# Patient Record
Sex: Male | Born: 1950
Health system: Southern US, Community
[De-identification: ages and names within clinical notes are randomized; demographics above are authoritative.]

## PROBLEM LIST (undated history)

## (undated) DIAGNOSIS — T148XXD Other injury of unspecified body region, subsequent encounter: Secondary | ICD-10-CM

## (undated) DIAGNOSIS — E119 Type 2 diabetes mellitus without complications: Secondary | ICD-10-CM

## (undated) DIAGNOSIS — R0602 Shortness of breath: Secondary | ICD-10-CM

## (undated) DIAGNOSIS — K259 Gastric ulcer, unspecified as acute or chronic, without hemorrhage or perforation: Secondary | ICD-10-CM

## (undated) DIAGNOSIS — M1990 Unspecified osteoarthritis, unspecified site: Secondary | ICD-10-CM

## (undated) DIAGNOSIS — I509 Heart failure, unspecified: Secondary | ICD-10-CM

## (undated) DIAGNOSIS — I10 Essential (primary) hypertension: Secondary | ICD-10-CM

## (undated) DIAGNOSIS — I4891 Unspecified atrial fibrillation: Secondary | ICD-10-CM

## (undated) DIAGNOSIS — J449 Chronic obstructive pulmonary disease, unspecified: Secondary | ICD-10-CM

## (undated) HISTORY — DX: Unspecified atrial fibrillation: I48.91

## (undated) HISTORY — PX: TRANSURETHRAL RESECTION OF PROSTATE: SHX73

## (undated) HISTORY — DX: Heart failure, unspecified: I50.9

## (undated) HISTORY — PX: HERNIA REPAIR: SHX51

## (undated) HISTORY — PX: CYSTOSCOPY: SHX5120

## (undated) HISTORY — PX: EYE SURGERY: SHX253

## (undated) HISTORY — PX: BACK SURGERY: SHX140

## (undated) HISTORY — DX: Type 2 diabetes mellitus without complications: E11.9

## (undated) SURGERY — COLONOSCOPY
Anesthesia: Moderate Sedation

---

## 2004-05-07 ENCOUNTER — Ambulatory Visit: Payer: Self-pay | Admitting: Family Medicine

## 2004-09-09 ENCOUNTER — Ambulatory Visit: Payer: Self-pay | Admitting: Family Medicine

## 2004-10-25 ENCOUNTER — Ambulatory Visit: Payer: Self-pay | Admitting: Family Medicine

## 2005-02-28 ENCOUNTER — Ambulatory Visit: Payer: Self-pay | Admitting: Family Medicine

## 2005-06-30 ENCOUNTER — Ambulatory Visit: Payer: Self-pay | Admitting: Family Medicine

## 2005-10-31 ENCOUNTER — Ambulatory Visit: Payer: Self-pay | Admitting: Family Medicine

## 2006-03-08 ENCOUNTER — Ambulatory Visit: Payer: Self-pay | Admitting: Family Medicine

## 2006-08-07 ENCOUNTER — Ambulatory Visit: Payer: Self-pay | Admitting: Family Medicine

## 2010-11-26 ENCOUNTER — Ambulatory Visit (HOSPITAL_COMMUNITY): Payer: BC Managed Care – PPO

## 2010-11-26 ENCOUNTER — Observation Stay (HOSPITAL_COMMUNITY)
Admission: RE | Admit: 2010-11-26 | Discharge: 2010-11-27 | Disposition: A | Discharge status: Home / Self Care | Payer: BC Managed Care – PPO | Source: Ambulatory Visit | Attending: Neurosurgery | Admitting: Neurosurgery

## 2010-11-26 ENCOUNTER — Observation Stay (HOSPITAL_COMMUNITY): Payer: BC Managed Care – PPO

## 2010-11-26 DIAGNOSIS — Z01810 Encounter for preprocedural cardiovascular examination: Secondary | ICD-10-CM | POA: Insufficient documentation

## 2010-11-26 DIAGNOSIS — M5126 Other intervertebral disc displacement, lumbar region: Principal | ICD-10-CM | POA: Insufficient documentation

## 2010-11-26 DIAGNOSIS — Z01811 Encounter for preprocedural respiratory examination: Secondary | ICD-10-CM

## 2010-11-26 DIAGNOSIS — Z01818 Encounter for other preprocedural examination: Secondary | ICD-10-CM | POA: Insufficient documentation

## 2010-11-26 DIAGNOSIS — F17200 Nicotine dependence, unspecified, uncomplicated: Secondary | ICD-10-CM | POA: Insufficient documentation

## 2010-11-26 DIAGNOSIS — J4489 Other specified chronic obstructive pulmonary disease: Secondary | ICD-10-CM | POA: Insufficient documentation

## 2010-11-26 DIAGNOSIS — I10 Essential (primary) hypertension: Secondary | ICD-10-CM | POA: Insufficient documentation

## 2010-11-26 DIAGNOSIS — J449 Chronic obstructive pulmonary disease, unspecified: Secondary | ICD-10-CM | POA: Insufficient documentation

## 2010-11-26 DIAGNOSIS — Z01812 Encounter for preprocedural laboratory examination: Secondary | ICD-10-CM | POA: Insufficient documentation

## 2010-11-26 LAB — CBC
Platelets: 246 10*3/uL (ref 150–400)
RDW: 13 % (ref 11.5–15.5)
WBC: 10.3 10*3/uL (ref 4.0–10.5)

## 2010-11-26 LAB — BASIC METABOLIC PANEL
Chloride: 103 mEq/L (ref 96–112)
GFR calc Af Amer: >60 mL/min (ref >60)
Potassium: 4.2 mEq/L (ref 3.5–5.1)

## 2010-11-26 LAB — SURGICAL PCR SCREEN: MRSA, PCR: NEGATIVE

## 2010-12-30 NOTE — Op Note (Signed)
  NAMEBRIGHAM, Tim Williams NO.:  [id number]  MEDICAL RECORD NO.:  [id number]  LOCATION:  3535                         FACILITY:  [facility]  PHYSICIAN:  Coletta Memos, M.D.     DATE OF BIRTH:  [birth year]  DATE OF PROCEDURE:  11/26/2010 DATE OF DISCHARGE:                              OPERATIVE REPORT   PREOPERATIVE DIAGNOSES: 1. Displaced disk right L4-5. 2. Right L4 and right L5 radiculopathy.  POSTOPERATIVE DIAGNOSES: 1. Displaced disk right L4-5. 2. Right L4 and right L5 radiculopathy.  PROCEDURE:  Right L4 semi-hemilaminectomy and diskectomy with microdissection.  SURGEON:  Coletta Memos, MD  ASSISTANT:  Hilda Lias, MD  FINDINGS:  Significant facet arthropathy and large fragments of disk material in the neural foramen and rostral to the disk space.  INDICATIONS:  Tim Williams presented to the office with severe pain in his back and right lower extremity.  He has weakness in his hip flexors and could barely walk.  MRI showed a very large disk herniation at L4-5 with fragments migrated into the neural foramen and rostrally behind the vertebral body of L4.  I recommended and he agreed to undergo operative decompression.  OPERATIVE NOTE:  Tim Williams was brought to the operating room, intubated and placed under general anesthetic without difficulty.  He was rolled prone onto a Wilson frame and all pressure points were properly padded. His back was prepped and he was then draped in a sterile fashion.  I infiltrated 10 mL of 0.5% lidocaine with 1:200,000 epinephrine into the lumbar region.  I opened the skin with a #10 blade and took this down to the thoracolumbar fascia.  I exposed the lamina of L4-L5 and confirmed by x-ray.  I used a high-speed drill to perform a semi hemilaminectomy of L4 along with Kerrison punches.  I removed the ligamentum flavum exposing the thecal sac.  I brought the microscope into the operative field and Dr. Jeral Fruit joined me in  microdissection.  We retracted the thecal sac medially.  I coagulated epidural veins lateral to the thecal sac and divided those sharply with scissors.  I then used right angle hooks to probe the epidural space both rostrally and in the foramen and was able to free up large fragments of disk material.  The disk space was markedly degenerated as is the facet on that side.  He does have a retrolisthesis of L4-L5.  However, this pain is new and although he may need a fusion in the future, right now he simply needed the disk removed.  I did not open the disk space as the fragments that were removed were more than sufficient to account for the MRI.  We inspected the space and the nerve root.  Everything seemed to be free of pressure. We then irrigated and closed the wound in layered fashion using Vicryl sutures to reapproximate the thoracolumbar, subcutaneous, and subcuticular layers.  I used Dermabond for sterile dressing.          ______________________________ Coletta Memos, M.D.     KC/MEDQ  D:  11/26/2010  T:  11/27/2010  Job:  [id number]  Electronically Signed by Coletta Memos M.D. on 12/30/2010 11:16:09 AM

## 2010-12-30 NOTE — Discharge Summary (Signed)
  NAMECURLEY, HOGEN NO.:  [id number]  MEDICAL RECORD NO.:  [id number]  LOCATION:  3535                         FACILITY:  [facility]  PHYSICIAN:  Coletta Memos, M.D.     DATE OF BIRTH:  02-28-1951  DATE OF ADMISSION:  11/26/2010 DATE OF DISCHARGE:  11/27/2010                              DISCHARGE SUMMARY   ADMITTING DIAGNOSES: 1. Displaced disk at right L4-5. 2. Right L4 and right L5 radiculopathies.  POSTOPERATIVE DIAGNOSES: 1. Displaced disk at right L4-5. 2. Right L4 and right L5 radiculopathies.  PROCEDURES:  Right L4 semi-hemilaminectomy and diskectomy.  FINDINGS:  Large fragments of free disk removed without difficulty.  The patient at discharge is able to void.  Tolerating a regular diet. Moving lower extremities well.  Tim Williams presented with weakness in the right lower extremity.  He also has significant pain.  MRI showed large disk herniation at L4-5.  I therefore recommended and he agreed to undergo operative decompression. His operation went well.  At discharge, he is doing better.  I will see him in the office in 3-4 weeks.  He was given a prescription for hydrocodone and Flexeril.  Wound is clean, dry, no signs of infection.          ______________________________ Coletta Memos, M.D.     KC/MEDQ  D:  11/26/2010  T:  11/27/2010  Job:  [id number]  Electronically Signed by Coletta Memos M.D. on 12/30/2010 11:16:12 AM

## 2012-03-26 ENCOUNTER — Other Ambulatory Visit (HOSPITAL_COMMUNITY): Payer: Self-pay | Admitting: Orthopaedic Surgery

## 2012-03-26 DIAGNOSIS — M1990 Unspecified osteoarthritis, unspecified site: Secondary | ICD-10-CM

## 2012-03-27 ENCOUNTER — Other Ambulatory Visit (HOSPITAL_COMMUNITY): Payer: Self-pay | Admitting: Orthopaedic Surgery

## 2012-03-29 ENCOUNTER — Ambulatory Visit (HOSPITAL_COMMUNITY)
Admission: RE | Admit: 2012-03-29 | Discharge: 2012-03-29 | Disposition: A | Discharge status: Home / Self Care | Payer: BC Managed Care – PPO | Source: Ambulatory Visit | Attending: Orthopaedic Surgery | Admitting: Orthopaedic Surgery

## 2012-03-29 DIAGNOSIS — M1710 Unilateral primary osteoarthritis, unspecified knee: Secondary | ICD-10-CM | POA: Insufficient documentation

## 2012-03-29 DIAGNOSIS — M25569 Pain in unspecified knee: Secondary | ICD-10-CM | POA: Insufficient documentation

## 2012-03-29 DIAGNOSIS — G8929 Other chronic pain: Secondary | ICD-10-CM | POA: Insufficient documentation

## 2012-03-29 DIAGNOSIS — M1990 Unspecified osteoarthritis, unspecified site: Secondary | ICD-10-CM

## 2012-03-29 DIAGNOSIS — IMO0002 Reserved for concepts with insufficient information to code with codable children: Secondary | ICD-10-CM | POA: Insufficient documentation

## 2012-05-07 ENCOUNTER — Encounter (HOSPITAL_COMMUNITY): Payer: Self-pay | Admitting: Pharmacy Technician

## 2012-05-09 NOTE — Patient Instructions (Addendum)
22 Tim Williams  05/09/2012   Your procedure is scheduled on:  05/17/12  Report to [facility] [place] [facility].  Call this number if you have problems the morning of surgery: [phone]   Remember:   Do not eat food:After Midnight.  May have clear liquids:until Midnight .  Clear liquids include soda, tea, black coffee, apple or grape juice, broth.  Take these medicines the morning of surgery with A SIP OF WATER: diovan   Do not wear jewelry, make-up or nail polish.  Do not wear lotions, powders, or perfumes. You may wear deodorant.  Do not shave 48 hours prior to surgery. Men may shave face and neck.  Do not bring valuables to the hospital.  Contacts, dentures or bridgework may not be worn into surgery.  Leave suitcase in the car. After surgery it may be brought to your room.  For patients admitted to the hospital, checkout time is 11:00 AM the day of discharge.   Patients discharged the day of surgery will not be allowed to drive home.  Name and phone number of your driver: family  Special Instructions: Shower using CHG 2 nights before surgery and the night before surgery.  If you shower the day of surgery use CHG.  Use special wash - you have one bottle of CHG for all showers.  You should use approximately 1/3 of the bottle for each shower.   Please read over the following fact sheets that you were given: Pain Booklet, MRSA Information, Surgical Site Infection Prevention, Anesthesia Post-op Instructions and Care and Recovery After Surgery   PATIENT INSTRUCTIONS POST-ANESTHESIA  IMMEDIATELY FOLLOWING SURGERY:  Do not drive or operate machinery for the first twenty four hours after surgery.  Do not make any important decisions for twenty four hours after surgery or while taking narcotic pain medications or sedatives.  If you develop intractable nausea and vomiting or a severe headache please notify your doctor immediately.  FOLLOW-UP:  Please make an appointment with your surgeon as  instructed. You do not need to follow up with anesthesia unless specifically instructed to do so.  WOUND CARE INSTRUCTIONS (if applicable):  Keep a dry clean dressing on the anesthesia/puncture wound site if there is drainage.  Once the wound has quit draining you may leave it open to air.  Generally you should leave the bandage intact for twenty four hours unless there is drainage.  If the epidural site drains for more than 36-48 hours please call the anesthesia department.  QUESTIONS?:  Please feel free to call your physician or the hospital operator if you have any questions, and they will be happy to assist you.      Total Knee Replacement Total knee replacement is a procedure to replace your knee joint with an artificial knee joint (prosthetic knee joint). The purpose of this surgery is to reduce pain and improve your knee function. LET YOUR CAREGIVER KNOW ABOUT:   Any allergies you have.  Any medicines you are taking, including vitamins, herbs, eyedrops, over-the-counter medicines, and creams.  Any problems you have had with the use of anesthetics.  Family history of problems with the use of anesthetics.  Any blood disorders you have, including bleeding problems or clotting problems.  Previous surgeries you have had. RISKS AND COMPLICATIONS  Generally, total knee replacement is a safe procedure. However, as with any surgical procedure, complications can occur. Possible complications associated with total knee replacement include:  Loss of range of motion of the knee or  instability.  Loosening of the prosthesis.  Infection.  Persistent pain. BEFORE THE PROCEDURE   Your caregiver will instruct you when you need to stop eating and drinking.  Ask your caregiver if you need to change or stop any regular medicines. PROCEDURE  Just before the procedure you will receive medicine that will make you drowsy (sedative). This will be given through a tube that is inserted into one of  your veins (intravenous [IV] tube). Then you will either receive medicine to block pain from the waist down through your legs (spinal block) or medicine to also receive medicine to make you fall asleep (general anesthetic). You may also receive medicine to block feeling in your leg (nerve block) to help ease pain after surgery. An incision will be made in your knee. Your surgeon will take out any damaged cartilage and bone by sawing off the damaged surfaces. Then the surgeon will put a new metal liner over the sawed off portion of your thigh bone (femur) and a plastic liner over the sawed off portion of one of the bones of your lower leg (tibia). This is to restore alignment and function to your knee. A plastic piece is often used to restore the surface of your knee cap. AFTER THE PROCEDURE  You will be taken to the recovery area. You may have drainage tubes to drain excess fluid from your knee. These tubes attach to a device that removes these fluids. Once you are awake, stable, and taking fluids well, you will be taken to your hospital room. You will receive physical therapy as prescribed by your caregiver. The length of your stay in the hospital after a knee replacement is 2 4 days. Your surgeon may recommend that you spend time (usually an additional 10 14 days) in an extended-care facility to help you begin walking again and improve your range of motion before you go home. You may also be prescribed blood-thinning medicine to decrease your risk of developing blood clots in your leg. Document Released: 09/05/2000 Document Revised: 11/29/2011 Document Reviewed: 07/10/2011 [facility] Patient Information 2013 [place], [place].

## 2012-05-14 ENCOUNTER — Encounter (HOSPITAL_COMMUNITY): Payer: Self-pay

## 2012-05-14 ENCOUNTER — Encounter (HOSPITAL_COMMUNITY)
Admission: RE | Admit: 2012-05-14 | Discharge: 2012-05-14 | Disposition: A | Discharge status: Home / Self Care | Payer: BC Managed Care – PPO | Source: Ambulatory Visit | Attending: Orthopaedic Surgery | Admitting: Orthopaedic Surgery

## 2012-05-14 HISTORY — DX: Gastric ulcer, unspecified as acute or chronic, without hemorrhage or perforation: K25.9

## 2012-05-14 HISTORY — DX: Essential (primary) hypertension: I10

## 2012-05-14 HISTORY — DX: Shortness of breath: R06.02

## 2012-05-14 HISTORY — DX: Unspecified osteoarthritis, unspecified site: M19.90

## 2012-05-14 LAB — COMPREHENSIVE METABOLIC PANEL
Alkaline Phosphatase: 52 U/L (ref 39–117)
BUN: 12 mg/dL (ref 6–23)
CO2: 29 mEq/L (ref 19–32)
Chloride: 99 mEq/L (ref 96–112)
Creatinine, Ser: 0.77 mg/dL (ref 0.50–1.35)
GFR calc Af Amer: >90 mL/min (ref >90)
GFR calc non Af Amer: >90 mL/min (ref >90)
Glucose, Bld: 153 mg/dL — ABNORMAL HIGH (ref 70–99)
Potassium: 3.6 mEq/L (ref 3.5–5.1)
Total Bilirubin: 0.3 mg/dL (ref 0.3–1.2)

## 2012-05-14 LAB — CBC WITH DIFFERENTIAL/PLATELET
Basophils Relative: 1 % (ref 0–1)
HCT: 48.8 % (ref 39.0–52.0)
Hemoglobin: 17.1 g/dL — ABNORMAL HIGH (ref 13.0–17.0)
Lymphocytes Relative: 24 % (ref 12–46)
Lymphs Abs: 2 10*3/uL (ref 0.7–4.0)
MCHC: 35 g/dL (ref 30.0–36.0)
Monocytes Absolute: 0.6 10*3/uL (ref 0.1–1.0)
Monocytes Relative: 7 % (ref 3–12)
Neutro Abs: 5.8 10*3/uL (ref 1.7–7.7)
RBC: 5.09 MIL/uL (ref 4.22–5.81)

## 2012-05-14 LAB — URINALYSIS, ROUTINE W REFLEX MICROSCOPIC
Bilirubin Urine: NEGATIVE
Ketones, ur: NEGATIVE mg/dL
Leukocytes, UA: NEGATIVE
Nitrite: NEGATIVE
Protein, ur: NEGATIVE mg/dL

## 2012-05-14 LAB — SURGICAL PCR SCREEN
MRSA, PCR: NEGATIVE
Staphylococcus aureus: NEGATIVE

## 2012-05-14 LAB — PREPARE RBC (CROSSMATCH)

## 2012-05-15 MED ORDER — CHLORHEXIDINE GLUCONATE 4 % EX LIQD: 60.0000 mL | Freq: Once | CUTANEOUS | Status: DC

## 2012-05-15 MED ORDER — CEFAZOLIN SODIUM-DEXTROSE 2-3 GM-% IV SOLR
2.0000 g | Freq: Once | INTRAVENOUS | Status: AC
  Administered 2012-05-17: 2 g via INTRAVENOUS

## 2012-05-15 MED ORDER — CEFAZOLIN SODIUM-DEXTROSE 2-3 GM-% IV SOLR: 2.0000 g | Freq: Once | INTRAVENOUS | Status: DC

## 2012-05-16 ENCOUNTER — Other Ambulatory Visit: Payer: Self-pay | Admitting: Radiology

## 2012-05-16 NOTE — H&P (Signed)
TOTAL KNEE ADMISSION H&P  Patient is being admitted for right total knee arthroplasty.  Subjective:  Chief Complaint:right knee pain.  HPI: Tim Williams, 61 y.o. male, has a history of pain and functional disability in the right knee due to arthritis and has failed non-surgical conservative treatments for greater than 12 weeks to includeNSAID's and/or analgesics, corticosteriod injections, flexibility and strengthening excercises, use of assistive devices and activity modification.  Onset of symptoms was gradual, starting >10 years ago with gradually worsening course since that time. The patient noted no past surgery on the right knee(s).  Patient currently rates pain in the right knee(s) at 6 out of 10 with activity. Patient has night pain, worsening of pain with activity and weight bearing, pain that interferes with activities of daily living, pain with passive range of motion, crepitus and joint swelling.  Patient has evidence of subchondral cysts, subchondral sclerosis, periarticular osteophytes, joint space narrowing and MRI showing severe end stage three compartment arthritis with loss of ACL by imaging studies. This patient has had no help with conservative treatment. There is no active infection.  I have discussed with him on several occasions about a total knee arthroplasty and the risks and imponderables.  He appears to understand having asked questions.  He understands this is an elective procedure.    There are no active problems to display for this patient.  Past Medical History  Diagnosis Date  . Hypertension   . Shortness of breath   . Arthritis   . Gastric ulcer     2009    Past Surgical History  Procedure Date  . Hernia repair     left age 68  . Eye surgery     muscle repair right eye-age 49  . Back surgery     lumbar discectomy  . Transurethral resection of prostate   . Cystoscopy     stone removal    No prescriptions prior to admission   Allergies  Allergen  Reactions  . Ibuprofen     Pt can't take due to ulcers.  . Penicillins Other (See Comments)    "knocks out"    History  Substance Use Topics  . Smoking status: Current Every Day Smoker -- 2.0 packs/day for 40 years    Types: Cigarettes  . Smokeless tobacco: Not on file  . Alcohol Use: No    No family history on file.   Review of Systems  Musculoskeletal: Positive for joint pain (Long history of right knee pain.  Injured it at age 41 and has had on and off pain for years.  Getting worse over the last couple of years.).  All other systems reviewed and are negative.    Objective:  Physical Exam  Constitutional: He is oriented to person, place, and time. He appears well-developed and well-nourished.  HENT:  Head: Atraumatic.  Eyes: Conjunctivae normal and EOM are normal. Pupils are equal, round, and reactive to light.  Neck: Normal range of motion. Neck supple.  Cardiovascular: Normal rate, regular rhythm, normal heart sounds and intact distal pulses.   Respiratory: Effort normal and breath sounds normal.  GI: Soft. Bowel sounds are normal.  Musculoskeletal: He exhibits tenderness (Pain of right knee with bow leg deformity, use of cane, crepitus, effusion, pain with motion.).       Right knee: He exhibits decreased range of motion, swelling, effusion, deformity and abnormal alignment. tenderness found. Medial joint line tenderness noted.       Legs: Neurological: He is alert  and oriented to person, place, and time. He has normal reflexes.  Skin: Skin is warm and dry.  Psychiatric: He has a normal mood and affect. His behavior is normal. Judgment and thought content normal.    Vital signs in last 24 hours:    Labs:   There is no height or weight on file to calculate BMI.   Imaging Review Plain radiographs demonstrate severe degenerative joint disease of the right knee(s). The overall alignment ismild varus. The bone quality appears to be good for age and reported activity  level.  Assessment/Plan:  End stage arthritis, right knee   The patient history, physical examination, clinical judgment of the provider and imaging studies are consistent with end stage degenerative joint disease of the right knee(s) and total knee arthroplasty is deemed medically necessary. The treatment options including medical management, injection therapy arthroscopy and arthroplasty were discussed at length. The risks and benefits of total knee arthroplasty were presented and reviewed. The risks due to aseptic loosening, infection, stiffness, patella tracking problems, thromboembolic complications and other imponderables were discussed. The patient acknowledged the explanation, agreed to proceed with the plan and consent was signed. Patient is being admitted for inpatient treatment for surgery, pain control, PT, OT, prophylactic antibiotics, VTE prophylaxis, progressive ambulation and ADL's and discharge planning. The patient is planning to be discharged to skilled nursing facility

## 2012-05-17 ENCOUNTER — Encounter (HOSPITAL_COMMUNITY)
Admission: RE | Disposition: A | Discharge status: Home Health | Payer: Self-pay | Source: Ambulatory Visit | Attending: Orthopaedic Surgery

## 2012-05-17 ENCOUNTER — Encounter (HOSPITAL_COMMUNITY): Payer: Self-pay

## 2012-05-17 ENCOUNTER — Encounter (HOSPITAL_COMMUNITY): Payer: Self-pay | Admitting: Anesthesiology

## 2012-05-17 ENCOUNTER — Ambulatory Visit (HOSPITAL_COMMUNITY): Payer: BC Managed Care – PPO

## 2012-05-17 ENCOUNTER — Ambulatory Visit (HOSPITAL_COMMUNITY): Payer: BC Managed Care – PPO | Admitting: Anesthesiology

## 2012-05-17 ENCOUNTER — Encounter (HOSPITAL_COMMUNITY): Payer: Self-pay | Admitting: *Deleted

## 2012-05-17 ENCOUNTER — Inpatient Hospital Stay (HOSPITAL_COMMUNITY)
Admission: RE | Admit: 2012-05-17 | Discharge: 2012-05-22 | DRG: 209 | Disposition: A | Discharge status: Home Health | Payer: BC Managed Care – PPO | Source: Ambulatory Visit | Attending: Orthopaedic Surgery | Admitting: Orthopaedic Surgery

## 2012-05-17 DIAGNOSIS — M129 Arthropathy, unspecified: Secondary | ICD-10-CM | POA: Diagnosis present

## 2012-05-17 DIAGNOSIS — F17200 Nicotine dependence, unspecified, uncomplicated: Secondary | ICD-10-CM | POA: Diagnosis present

## 2012-05-17 DIAGNOSIS — J4489 Other specified chronic obstructive pulmonary disease: Secondary | ICD-10-CM | POA: Diagnosis present

## 2012-05-17 DIAGNOSIS — I10 Essential (primary) hypertension: Secondary | ICD-10-CM | POA: Diagnosis present

## 2012-05-17 DIAGNOSIS — M1710 Unilateral primary osteoarthritis, unspecified knee: Principal | ICD-10-CM | POA: Diagnosis present

## 2012-05-17 DIAGNOSIS — M1711 Unilateral primary osteoarthritis, right knee: Secondary | ICD-10-CM

## 2012-05-17 DIAGNOSIS — Z880 Allergy status to penicillin: Secondary | ICD-10-CM

## 2012-05-17 DIAGNOSIS — Z886 Allergy status to analgesic agent status: Secondary | ICD-10-CM

## 2012-05-17 DIAGNOSIS — Z8711 Personal history of peptic ulcer disease: Secondary | ICD-10-CM

## 2012-05-17 DIAGNOSIS — J449 Chronic obstructive pulmonary disease, unspecified: Secondary | ICD-10-CM | POA: Diagnosis present

## 2012-05-17 DIAGNOSIS — M25469 Effusion, unspecified knee: Secondary | ICD-10-CM | POA: Diagnosis present

## 2012-05-17 HISTORY — DX: Chronic obstructive pulmonary disease, unspecified: J44.9

## 2012-05-17 HISTORY — PX: TOTAL KNEE ARTHROPLASTY: SHX125

## 2012-05-17 SURGERY — ARTHROPLASTY, KNEE, TOTAL
Anesthesia: Spinal | Site: Knee | Laterality: Right | Wound class: Clean

## 2012-05-17 MED ORDER — SODIUM CHLORIDE 0.9 % IJ SOLN: 9.0000 mL | INTRAMUSCULAR | Status: DC | PRN

## 2012-05-17 MED ORDER — MAGNESIUM HYDROXIDE 400 MG/5ML PO SUSP
30.0000 mL | Freq: Every day | ORAL | Status: DC | PRN
Start: 2012-05-17 — End: 2012-05-22

## 2012-05-17 MED ORDER — FENTANYL CITRATE 0.05 MG/ML IJ SOLN
INTRAMUSCULAR | Status: DC | PRN
  Administered 2012-05-17: 25 ug via INTRATHECAL
  Administered 2012-05-17: 50 ug via INTRAVENOUS
  Administered 2012-05-17: 25 ug via INTRAVENOUS

## 2012-05-17 MED ORDER — EPHEDRINE SULFATE 50 MG/ML IJ SOLN
INTRAMUSCULAR | Status: AC
  Filled 2012-05-17: qty 1

## 2012-05-17 MED ORDER — DIPHENHYDRAMINE HCL 50 MG/ML IJ SOLN: 12.5000 mg | Freq: Four times a day (QID) | INTRAMUSCULAR | Status: DC | PRN

## 2012-05-17 MED ORDER — VALSARTAN-HYDROCHLOROTHIAZIDE 160-12.5 MG PO TABS: 1.0000 | ORAL_TABLET | Freq: Every day | ORAL | Status: DC

## 2012-05-17 MED ORDER — NALOXONE HCL 0.4 MG/ML IJ SOLN: 0.4000 mg | INTRAMUSCULAR | Status: DC | PRN

## 2012-05-17 MED ORDER — CEFAZOLIN SODIUM 1-5 GM-% IV SOLN
1.0000 g | Freq: Three times a day (TID) | INTRAVENOUS | Status: AC
  Administered 2012-05-18 (×2): 1 g via INTRAVENOUS
  Filled 2012-05-17 (×2): qty 50

## 2012-05-17 MED ORDER — MIDAZOLAM HCL 2 MG/2ML IJ SOLN
INTRAMUSCULAR | Status: AC
  Filled 2012-05-17: qty 2

## 2012-05-17 MED ORDER — EPHEDRINE SULFATE 50 MG/ML IJ SOLN
INTRAMUSCULAR | Status: DC | PRN
  Administered 2012-05-17: 5 mg via INTRAVENOUS
  Administered 2012-05-17: 10 mg via INTRAVENOUS
  Administered 2012-05-17: 5 mg via INTRAVENOUS
  Administered 2012-05-17: 10 mg via INTRAVENOUS

## 2012-05-17 MED ORDER — PHENYLEPHRINE HCL 10 MG/ML IJ SOLN
INTRAMUSCULAR | Status: DC | PRN
  Administered 2012-05-17: 100 ug via INTRAVENOUS
  Administered 2012-05-17: 50 ug via INTRAVENOUS

## 2012-05-17 MED ORDER — ONDANSETRON HCL 4 MG/2ML IJ SOLN: 4.0000 mg | Freq: Four times a day (QID) | INTRAMUSCULAR | Status: DC | PRN

## 2012-05-17 MED ORDER — BUPIVACAINE IN DEXTROSE 0.75-8.25 % IT SOLN
INTRATHECAL | Status: AC
  Filled 2012-05-17: qty 2

## 2012-05-17 MED ORDER — PROPOFOL 10 MG/ML IV EMUL
INTRAVENOUS | Status: AC
  Filled 2012-05-17: qty 20

## 2012-05-17 MED ORDER — MIDAZOLAM HCL 2 MG/2ML IJ SOLN
1.0000 mg | INTRAMUSCULAR | Status: DC | PRN
  Administered 2012-05-17: 2 mg via INTRAVENOUS

## 2012-05-17 MED ORDER — SODIUM CHLORIDE 0.9 % IR SOLN
Status: DC | PRN
  Administered 2012-05-17: 3000 mL
  Administered 2012-05-17: 1000 mL

## 2012-05-17 MED ORDER — ONDANSETRON HCL 4 MG/2ML IJ SOLN: 4.0000 mg | Freq: Once | INTRAMUSCULAR | Status: DC | PRN

## 2012-05-17 MED ORDER — LACTATED RINGERS IV SOLN: INTRAVENOUS | Status: DC

## 2012-05-17 MED ORDER — ZOLPIDEM TARTRATE 5 MG PO TABS
5.0000 mg | ORAL_TABLET | Freq: Every day | ORAL | Status: DC
  Administered 2012-05-17 – 2012-05-21 (×4): 5 mg via ORAL
  Filled 2012-05-17 (×4): qty 1

## 2012-05-17 MED ORDER — ONDANSETRON HCL 4 MG/2ML IJ SOLN
4.0000 mg | Freq: Once | INTRAMUSCULAR | Status: AC
  Administered 2012-05-17: 4 mg via INTRAVENOUS

## 2012-05-17 MED ORDER — PROMETHAZINE HCL 25 MG/ML IJ SOLN: 12.5000 mg | INTRAMUSCULAR | Status: DC | PRN

## 2012-05-17 MED ORDER — ONDANSETRON HCL 4 MG/2ML IJ SOLN
INTRAMUSCULAR | Status: AC
  Filled 2012-05-17: qty 2

## 2012-05-17 MED ORDER — CHLORHEXIDINE GLUCONATE 4 % EX LIQD: 60.0000 mL | Freq: Once | CUTANEOUS | Status: AC

## 2012-05-17 MED ORDER — ACETAMINOPHEN 325 MG PO TABS: 650.0000 mg | ORAL_TABLET | Freq: Four times a day (QID) | ORAL | Status: DC | PRN

## 2012-05-17 MED ORDER — HYDROCHLOROTHIAZIDE 12.5 MG PO CAPS
12.5000 mg | ORAL_CAPSULE | Freq: Every day | ORAL | Status: DC
  Administered 2012-05-18 – 2012-05-22 (×5): 12.5 mg via ORAL
  Filled 2012-05-17 (×5): qty 1

## 2012-05-17 MED ORDER — LACTATED RINGERS IV SOLN
INTRAVENOUS | Status: DC
  Administered 2012-05-17: 09:00:00 via INTRAVENOUS
  Administered 2012-05-17: 1000 mL via INTRAVENOUS

## 2012-05-17 MED ORDER — DEXTROSE-NACL 5-0.45 % IV SOLN
INTRAVENOUS | Status: DC
  Administered 2012-05-17: 12:00:00 via INTRAVENOUS

## 2012-05-17 MED ORDER — ENOXAPARIN SODIUM 40 MG/0.4ML ~~LOC~~ SOLN
40.0000 mg | SUBCUTANEOUS | Status: DC
  Administered 2012-05-18 – 2012-05-22 (×5): 40 mg via SUBCUTANEOUS
  Filled 2012-05-17 (×5): qty 0.4

## 2012-05-17 MED ORDER — DIPHENHYDRAMINE HCL 12.5 MG/5ML PO ELIX: 12.5000 mg | ORAL_SOLUTION | Freq: Four times a day (QID) | ORAL | Status: DC | PRN

## 2012-05-17 MED ORDER — ALBUTEROL SULFATE (5 MG/ML) 0.5% IN NEBU
2.5000 mg | INHALATION_SOLUTION | Freq: Four times a day (QID) | RESPIRATORY_TRACT | Status: DC
  Administered 2012-05-17 – 2012-05-19 (×10): 2.5 mg via RESPIRATORY_TRACT
  Filled 2012-05-17 (×10): qty 0.5

## 2012-05-17 MED ORDER — PHENYLEPHRINE HCL 10 MG/ML IJ SOLN
INTRAMUSCULAR | Status: AC
  Filled 2012-05-17: qty 1

## 2012-05-17 MED ORDER — MIDAZOLAM HCL 5 MG/5ML IJ SOLN
INTRAMUSCULAR | Status: DC | PRN
  Administered 2012-05-17: 1 mg via INTRAVENOUS
  Administered 2012-05-17: 2 mg via INTRAVENOUS
  Administered 2012-05-17 (×2): 1 mg via INTRAVENOUS

## 2012-05-17 MED ORDER — BUPIVACAINE IN DEXTROSE 0.75-8.25 % IT SOLN
INTRATHECAL | Status: DC | PRN
  Administered 2012-05-17: 15 mg via INTRATHECAL

## 2012-05-17 MED ORDER — FENTANYL CITRATE 0.05 MG/ML IJ SOLN: 25.0000 ug | INTRAMUSCULAR | Status: DC | PRN

## 2012-05-17 MED ORDER — IRBESARTAN 150 MG PO TABS
150.0000 mg | ORAL_TABLET | Freq: Every day | ORAL | Status: DC
  Administered 2012-05-18 – 2012-05-22 (×5): 150 mg via ORAL
  Filled 2012-05-17 (×5): qty 1

## 2012-05-17 MED ORDER — PROPOFOL INFUSION 10 MG/ML OPTIME
INTRAVENOUS | Status: DC | PRN
  Administered 2012-05-17: 25 ug/kg/min via INTRAVENOUS
  Administered 2012-05-17: 09:00:00 via INTRAVENOUS

## 2012-05-17 MED ORDER — CEFAZOLIN SODIUM-DEXTROSE 2-3 GM-% IV SOLR
INTRAVENOUS | Status: AC
  Filled 2012-05-17: qty 50

## 2012-05-17 MED ORDER — FENTANYL CITRATE 0.05 MG/ML IJ SOLN
INTRAMUSCULAR | Status: AC
  Filled 2012-05-17: qty 2

## 2012-05-17 MED ORDER — CEFAZOLIN SODIUM-DEXTROSE 2-3 GM-% IV SOLR: 2.0000 g | Freq: Once | INTRAVENOUS | Status: DC

## 2012-05-17 MED ORDER — MORPHINE SULFATE (PF) 1 MG/ML IV SOLN
INTRAVENOUS | Status: DC
Start: 2012-05-17 — End: 2012-05-21
  Administered 2012-05-17: 12:00:00 via INTRAVENOUS
  Administered 2012-05-17: 12 mg via INTRAVENOUS
  Administered 2012-05-17: 3 mg via INTRAVENOUS
  Administered 2012-05-18: 7.5 mg via INTRAVENOUS
  Administered 2012-05-18: 08:00:00 via INTRAVENOUS
  Administered 2012-05-18 – 2012-05-19 (×3): 1.5 mg via INTRAVENOUS
  Administered 2012-05-19 (×2): 3 mg via INTRAVENOUS
  Administered 2012-05-19: 1.5 mg via INTRAVENOUS
  Administered 2012-05-20: 23:00:00 via INTRAVENOUS
  Administered 2012-05-20 (×3): 1.5 mg via INTRAVENOUS
  Administered 2012-05-20: 3 mg via INTRAVENOUS
  Administered 2012-05-20 – 2012-05-21 (×3): 1.5 mg via INTRAVENOUS
  Filled 2012-05-17 (×3): qty 25

## 2012-05-17 MED ORDER — LIDOCAINE HCL (PF) 1 % IJ SOLN
INTRAMUSCULAR | Status: AC
  Filled 2012-05-17: qty 5

## 2012-05-17 SURGICAL SUPPLY — 75 items
BAG HAMPER (MISCELLANEOUS) ×2 IMPLANT
BANDAGE ELASTIC 4 VELCRO NS (GAUZE/BANDAGES/DRESSINGS) ×2 IMPLANT
BANDAGE ELASTIC 6 VELCRO NS (GAUZE/BANDAGES/DRESSINGS) ×3 IMPLANT
BANDAGE ESMARK 6X9 LF (GAUZE/BANDAGES/DRESSINGS) ×1 IMPLANT
BIT DRILL 3.2X128 (BIT) IMPLANT
BLADE HEX COATED 2.75 (ELECTRODE) ×2 IMPLANT
BLADE SAW RECIP 87.9 MT (BLADE) ×2 IMPLANT
BLOCK RT KIT LGNP (DISPOSABLE) ×1 IMPLANT
BNDG CMPR 9X6 STRL LF SNTH (GAUZE/BANDAGES/DRESSINGS) ×1
BNDG ESMARK 6X9 LF (GAUZE/BANDAGES/DRESSINGS) ×2
CHLORAPREP W/TINT 26ML (MISCELLANEOUS) ×2 IMPLANT
CLOTH BEACON ORANGE TIMEOUT ST (SAFETY) ×2 IMPLANT
COOLER CRYO CUFF IC AND MOTOR (MISCELLANEOUS) ×2 IMPLANT
COVER LIGHT HANDLE STERIS (MISCELLANEOUS) ×4 IMPLANT
COVER MAYO STAND XLG (DRAPE) ×2 IMPLANT
COVER X RAY CASSETTE (MISCELLANEOUS) ×4 IMPLANT
CUFF CRYO KNEE LG 20X31 COOLER (ORTHOPEDIC SUPPLIES) IMPLANT
CUFF CRYO KNEE18X23 MED (MISCELLANEOUS) ×2 IMPLANT
CUFF TOURNIQUET SINGLE 34IN LL (TOURNIQUET CUFF) ×1 IMPLANT
DRAPE BACK TABLE (DRAPES) ×2 IMPLANT
DRAPE ORTHO 2.5IN SPLIT 77X108 (DRAPES) ×2 IMPLANT
DRAPE ORTHO SPLIT 77X108 STRL (DRAPES) ×4
DRAPE PROXIMA HALF (DRAPES) ×3 IMPLANT
ELECT REM PT RETURN 9FT ADLT (ELECTROSURGICAL) ×2
ELECTRODE REM PT RTRN 9FT ADLT (ELECTROSURGICAL) ×1 IMPLANT
EVACUATOR 3/16  PVC DRAIN (DRAIN) ×1
EVACUATOR 3/16 PVC DRAIN (DRAIN) ×1 IMPLANT
FACESHIELD LNG OPTICON STERILE (SAFETY) ×2 IMPLANT
FORMALIN 10 PREFIL 480ML (MISCELLANEOUS) ×2 IMPLANT
GAUZE XEROFORM 5X9 LF (GAUZE/BANDAGES/DRESSINGS) ×1 IMPLANT
GLOVE BIO SURGEON STRL SZ8 (GLOVE) ×2 IMPLANT
GLOVE BIO SURGEON STRL SZ8.5 (GLOVE) ×3 IMPLANT
GLOVE BIOGEL PI IND STRL 7.0 (GLOVE) IMPLANT
GLOVE BIOGEL PI IND STRL 7.5 (GLOVE) IMPLANT
GLOVE BIOGEL PI INDICATOR 7.0 (GLOVE) ×4
GLOVE BIOGEL PI INDICATOR 7.5 (GLOVE) ×1
GLOVE ECLIPSE 6.5 STRL STRAW (GLOVE) ×3 IMPLANT
GLOVE ECLIPSE 7.0 STRL STRAW (GLOVE) ×2 IMPLANT
GLOVE EXAM NITRILE MD LF STRL (GLOVE) ×3 IMPLANT
GOWN STRL REIN XL XLG (GOWN DISPOSABLE) ×8 IMPLANT
HANDPIECE INTERPULSE COAX TIP (DISPOSABLE) ×2
HOOD W/PEELAWAY (MISCELLANEOUS) ×8 IMPLANT
INST SET MAJOR BONE (KITS) ×2 IMPLANT
IV NS IRRIG 3000ML ARTHROMATIC (IV SOLUTION) ×2 IMPLANT
K-WIRE DBL PT .062 (WIRE) ×1 IMPLANT
KIT BLADEGUARD II DBL (SET/KITS/TRAYS/PACK) ×2 IMPLANT
KIT ROOM TURNOVER APOR (KITS) ×2 IMPLANT
MANIFOLD NEPTUNE II (INSTRUMENTS) ×2 IMPLANT
MARKER SKIN DUAL TIP RULER LAB (MISCELLANEOUS) ×2 IMPLANT
NS IRRIG 1000ML POUR BTL (IV SOLUTION) ×2 IMPLANT
PACK TOTAL JOINT (CUSTOM PROCEDURE TRAY) ×2 IMPLANT
PAD ABD 5X9 TENDERSORB (GAUZE/BANDAGES/DRESSINGS) ×4 IMPLANT
PAD ARMBOARD 7.5X6 YLW CONV (MISCELLANEOUS) ×2 IMPLANT
PAD CAST 4YDX4 CTTN HI CHSV (CAST SUPPLIES) ×1 IMPLANT
PAD DANNIFLEX CPM (ORTHOPEDIC SUPPLIES) ×2 IMPLANT
PADDING CAST COTTON 4X4 STRL (CAST SUPPLIES) ×2
PADDING CAST COTTON 6X4 STRL (CAST SUPPLIES) ×3 IMPLANT
SAGITTAL BLADE ×1 IMPLANT
SET BASIN LINEN APH (SET/KITS/TRAYS/PACK) ×2 IMPLANT
SET HNDPC FAN SPRY TIP SCT (DISPOSABLE) ×1 IMPLANT
SPONGE DRAIN TRACH 4X4 STRL 2S (GAUZE/BANDAGES/DRESSINGS) ×1 IMPLANT
SPONGE GAUZE 4X4 12PLY (GAUZE/BANDAGES/DRESSINGS) ×2 IMPLANT
STAPLER VISISTAT 35W (STAPLE) ×3 IMPLANT
STRYKER SAGITTAL BLADE ×1 IMPLANT
SUT PLAIN 2 0 XLH (SUTURE) ×6 IMPLANT
SUT SILK 0 FSL (SUTURE) ×2 IMPLANT
SUT SURGILON 1 5X18 (SUTURE) ×9 IMPLANT
SYR BULB IRRIGATION 50ML (SYRINGE) ×2 IMPLANT
SYSTEM VACUUM MIXING (DISPOSABLE) ×1 IMPLANT
TAPE CLOTH SURG 4X10 WHT LF (GAUZE/BANDAGES/DRESSINGS) ×1 IMPLANT
TOWEL OR 17X26 4PK STRL BLUE (TOWEL DISPOSABLE) ×2 IMPLANT
TRAY FOLEY CATH 14FR (SET/KITS/TRAYS/PACK) ×2 IMPLANT
VERSABOND 40 GRAM (Cement) ×4 IMPLANT
YANKAUER SUCT 12FT TUBE ARGYLE (SUCTIONS) ×3 IMPLANT
YANKAUER SUCT BULB TIP 10FT TU (MISCELLANEOUS) ×1 IMPLANT

## 2012-05-17 NOTE — Clinical Social Work Note (Signed)
Per MD, patient is requesting SNF for rehab at discharge.  CSW noted that patient has BCBS PPO, card image not on file.  CSW will contact Financial Counselor to verify if patient has SNF benefits.  CSW completed PASARR screen and prepared FL2.  Oncoming CSW will conduct bed search.  Santa Genera, LCSW Clinical Social Worker [phone])

## 2012-05-17 NOTE — Evaluation (Signed)
Physical Therapy Evaluation Patient Details Name: Tim Williams MRN: [id number] DOB: January 13, 1951 Today's Date: 05/17/2012 Time: 4098-1191 PT Time Calculation (min): 77 min  PT Assessment / Plan / Recommendation Clinical Impression  Pt was seen for initial eva/tx following surgery earlier today.  He is alert and cooperative, lives alone and had been independent with all ADLs PTA.  He  is having significant pain with movement of the R knee (use of PCA encouraged) and ROM only 10-30 deg.  He was able to transfer OOB and ambulate 10' with walker, TTWB R with stable gait and no pain.  This pt  tends to have his own ideas as to what he can and cannot do, as well as how to do it.  He is not very open to instruction from PT as to the optimal methods for increasing mobility post surgery.   Hopefully this will moderate.  Because he lives alone and children work, he feels that he will need SNF at d/c.  In my opinion, he probably will due to general pain level with trying to get knee ROM.    PT Assessment  Patient needs continued PT services    Follow Up Recommendations  SNF    Does the patient have the potential to tolerate intense rehabilitation      Barriers to Discharge Decreased caregiver support      Equipment Recommendations  None recommended by PT    Recommendations for Other Services     Frequency 7X/week    Precautions / Restrictions Precautions Precautions: Knee Precaution Booklet Issued: No Precaution Comments: received at total joint class Restrictions Weight Bearing Restrictions: Yes RLE Weight Bearing: Touchdown weight bearing   Pertinent Vitals/Pain       Mobility  Bed Mobility Bed Mobility: Supine to Sit;Sit to Supine Supine to Sit: 3: Mod assist;HOB elevated Sit to Supine: 3: Mod assist;HOB flat Details for Bed Mobility Assistance: Pt tends to have his "own way of doing things" and is not very open to suggestions from PT as to correct technique for post surgical  management of R knee Transfers Transfers: Sit to Stand;Stand to Sit Sit to Stand: 4: Min assist;With upper extremity assist;From bed Stand to Sit: 3: Mod assist;With upper extremity assist;To bed Details for Transfer Assistance: Pt is very sensitive to any movement of R knee and transfer is therefore very labored Ambulation/Gait Ambulation/Gait Assistance: 4: Min assist Ambulation Distance (Feet): 10 Feet Assistive device: Rolling walker Gait Pattern: Decreased hip/knee flexion - right;Decreased stance time - right;Step-through pattern General Gait Details: no pain with TTWB R, stable gait technique Stairs: No Wheelchair Mobility Wheelchair Mobility: No    Shoulder Instructions     Exercises Total Joint Exercises Ankle Circles/Pumps: AROM;Both;10 reps;Supine Quad Sets: AROM;Both;10 reps;Supine Short Arc Quad: AAROM;Right;10 reps;Supine Heel Slides: AAROM;Right;10 reps;Supine Goniometric ROM: 10-30 deg   PT Diagnosis: Difficulty walking;Abnormality of gait;Acute pain  PT Problem List: Decreased strength;Decreased range of motion;Decreased activity tolerance;Decreased mobility;Decreased knowledge of use of DME;Decreased safety awareness;Decreased knowledge of precautions;Pain PT Treatment Interventions: DME instruction;Gait training;Functional mobility training;Therapeutic activities;Therapeutic exercise;Patient/family education   PT Goals Acute Rehab PT Goals PT Goal Formulation: With patient Time For Goal Achievement: 05/31/12 Potential to Achieve Goals: Good Pt will go Supine/Side to Sit: with supervision;with HOB 0 degrees PT Goal: Supine/Side to Sit - Progress: Goal set today Pt will go Sit to Supine/Side: with supervision;with HOB 0 degrees PT Goal: Sit to Supine/Side - Progress: Goal set today Pt will go Sit to Stand: with supervision;with upper  extremity assist PT Goal: Sit to Stand - Progress: Goal set today Pt will go Stand to Sit: with supervision;with upper extremity  assist PT Goal: Stand to Sit - Progress: Goal set today Pt will Ambulate: 16 - 50 feet;with supervision;with rolling walker PT Goal: Ambulate - Progress: Goal set today  Visit Information  Last PT Received On: 05/17/12    Subjective Data  Subjective: This Machine is killing me! Patient Stated Goal: relief from knee pain   Prior Functioning  Home Living Lives With: Alone Available Help at Discharge: Family;Available PRN/intermittently Type of Home: House Home Access: Ramped entrance Home Layout: One level Bathroom Shower/Tub: Engineer, manufacturing systems: Standard Home Adaptive Equipment: Bedside commode/3-in-1;Walker - rolling;Straight cane;Quad cane Prior Function Level of Independence: Independent with assistive device(s) (used a cane at times) Able to Take Stairs?: Yes Driving: Yes Communication Communication: HOH    Cognition  Overall Cognitive Status: Appears within functional limits for tasks assessed/performed Arousal/Alertness: Awake/alert Orientation Level: Appears intact for tasks assessed Behavior During Session: [facility] for tasks performed    Extremity/Trunk Assessment Right Lower Extremity Assessment RLE ROM/Strength/Tone: Deficits;Unable to fully assess;Due to pain RLE ROM/Strength/Tone Deficits: appears to have 10-30 deg ROM of knee, AA RLE Sensation: WFL - Light Touch Left Lower Extremity Assessment LLE ROM/Strength/Tone: WFL for tasks assessed LLE Sensation: WFL - Light Touch LLE Coordination: WFL - gross motor Trunk Assessment Trunk Assessment: Normal   Balance    End of Session PT - End of Session Equipment Utilized During Treatment: Gait belt Activity Tolerance: Patient limited by fatigue;Patient limited by pain Patient left: in bed;in CPM;with call bell/phone within reach CPM Right Knee CPM Right Knee: On Right Knee Flexion (Degrees): 45  Right Knee Extension (Degrees): -5  Additional Comments: CPM had not been positioned correctly under pt's  RLE when I entered the room...it was adjusted and positioned correctly, the speed was slowed and now is very comfortable to pt  GP     [name] L 05/17/2012, 5:06 PM

## 2012-05-17 NOTE — Clinical Social Work Placement (Signed)
    Clinical Social Work Department CLINICAL SOCIAL WORK PLACEMENT NOTE 05/17/2012  Patient:  Tim Williams, Tim Williams  Account Number:  [id number] Admit date:  05/17/2012  Clinical Social Worker:  Santa Genera, CLINICAL SOCIAL WORKER  Date/time:  05/17/2012 12:00 N  Clinical Social Work is seeking post-discharge placement for this patient at the following level of care:   SKILLED NURSING   (*CSW will update this form in Epic as items are completed)     Patient/family provided with [facility] Department of Clinical Social Work's list of facilities offering this level of care within the geographic area requested by the patient (or if unable, by the patient's family).    Patient/family informed of their freedom to choose among providers that offer the needed level of care, that participate in Medicare, Medicaid or managed care program needed by the patient, have an available bed and are willing to accept the patient.    Patient/family informed of [facility]' ownership interest in [facility], as well as of the fact that they are under no obligation to receive care at this facility.  PASARR submitted to EDS on 05/17/2012 PASARR number received from EDS on 05/17/2012  FL2 transmitted to all facilities in geographic area requested by pt/family on   FL2 transmitted to all facilities within larger geographic area on   Patient informed that his/her managed care company has contracts with or will negotiate with  certain facilities, including the following:     Patient/family informed of bed offers received:   Patient chooses bed at  Physician recommends and patient chooses bed at    Patient to be transferred to  on   Patient to be transferred to facility by   The following physician request were entered in Epic:   Additional Comments: Patient immediately post surgery.  Has BCBS PPO.  Will need to verify insurance coverage for SNF.  Per MD, patient is requesting SNF placement at  discharge.  Santa Genera, LCSW Clinical Social Worker [phone])

## 2012-05-17 NOTE — Transfer of Care (Addendum)
Immediate Anesthesia Transfer of Care Note  Patient: Tim Williams  Procedure(s) Performed: Procedure(s) (LRB) with comments: TOTAL KNEE ARTHROPLASTY (Right)  Patient Location: PACU  Anesthesia Type:spinal  Level of Consciousness: awake, alert  and oriented  Airway & Oxygen Therapy: Patient Spontanous Breathing and Patient connected to nasal cannula oxygen  Post-op Assessment: Report given to PACU RN and Post -op Vital signs reviewed and stable  Post vital signs: Reviewed  Complications: No apparent anesthesia complications

## 2012-05-17 NOTE — Progress Notes (Signed)
UR COMPLETED  

## 2012-05-17 NOTE — Progress Notes (Signed)
The History and Physical is unchanged. I have examined the patient. The patient is medically able to have surgery on the right knee . Venera Privott 

## 2012-05-17 NOTE — Anesthesia Preprocedure Evaluation (Signed)
Anesthesia Evaluation  Patient identified by MRN, date of birth, ID band Patient awake    Reviewed: Allergy & Precautions, H&P , NPO status , Patient's Chart, lab work & pertinent test results  History of Anesthesia Complications Negative for: history of anesthetic complications  Airway Mallampati: II TM Distance: >3 FB     Dental  (+) Partial Lower, Partial Upper, Chipped, Missing, Poor Dentition and Dental Advisory Given   Pulmonary shortness of breath, COPDCurrent Smoker,  breath sounds clear to auscultation        Cardiovascular hypertension, Pt. on medications Rhythm:Regular Rate:Normal     Neuro/Psych    GI/Hepatic PUD,   Endo/Other    Renal/GU      Musculoskeletal   Abdominal   Peds  Hematology   Anesthesia Other Findings   Reproductive/Obstetrics                           Anesthesia Physical Anesthesia Plan  ASA: III  Anesthesia Plan: Spinal   Post-op Pain Management:    Induction:   Airway Management Planned: Nasal Cannula  Additional Equipment:   Intra-op Plan:   Post-operative Plan:   Informed Consent: I have reviewed the patients History and Physical, chart, labs and discussed the procedure including the risks, benefits and alternatives for the proposed anesthesia with the patient or authorized representative who has indicated his/her understanding and acceptance.     Plan Discussed with:   Anesthesia Plan Comments:         Anesthesia Quick Evaluation

## 2012-05-17 NOTE — Brief Op Note (Signed)
05/17/2012  10:02 AM  PATIENT:  Tim Williams  61 y.o. male  PRE-OPERATIVE DIAGNOSIS:  Degenerative Joint Disease of Right Knee, severe  POST-OPERATIVE DIAGNOSIS:  Degenerative Joint Disease of Right Knee, severe  PROCEDURE:  Procedure(s) (LRB) with comments: TOTAL KNEE ARTHROPLASTY (Right)  SURGEON:  Surgeon(s) and Role:    * Darreld Mclean, MD - Primary  PHYSICIAN ASSISTANT:   ASSISTANTS: [place]   ANESTHESIA:   spinal  EBL:  Total I/O In: 1000 [I.V.:1000] Out: 150 [Urine:100; Blood:50]  BLOOD ADMINISTERED:none  DRAINS: (large) Hemovact drain(s) in the right knee with  Suction Open   LOCAL MEDICATIONS USED:  NONE  SPECIMEN:  Source of Specimen:  right knee bone fragments  DISPOSITION OF SPECIMEN:  PATHOLOGY  COUNTS:  YES  TOURNIQUET:   Total Tourniquet Time Documented: Thigh (Right) - 97 minutes  DICTATION: .Other Dictation: Dictation Number [id number]  PLAN OF CARE: Admit to inpatient   PATIENT DISPOSITION:  PACU - hemodynamically stable.   Delay start of Pharmacological VTE agent (>24hrs) due to surgical blood loss or risk of bleeding: no

## 2012-05-17 NOTE — Anesthesia Postprocedure Evaluation (Signed)
  Anesthesia Post-op Note  Patient: Tim Williams  Procedure(s) Performed: Procedure(s) (LRB) with comments: TOTAL KNEE ARTHROPLASTY (Right)  Patient Location: PACU  Anesthesia Type:Spinal  Level of Consciousness: awake, alert  and oriented  Airway and Oxygen Therapy: Patient Spontanous Breathing and Patient connected to nasal cannula oxygen  Post-op Pain: none  Post-op Assessment: Post-op Vital signs reviewed, Patient's Cardiovascular Status Stable, Respiratory Function Stable, Patent Airway and No signs of Nausea or vomiting  Post-op Vital Signs: Reviewed and stable  Complications: No apparent anesthesia complications

## 2012-05-17 NOTE — Anesthesia Procedure Notes (Signed)
Spinal  Patient location during procedure: OR Start time: 05/17/2012 7:39 AM Staffing CRNA/Resident: Glynn Octave E Preanesthetic Checklist Completed: patient identified, site marked, surgical consent, pre-op evaluation, timeout performed, IV checked, risks and benefits discussed and monitors and equipment checked Spinal Block Patient position: right lateral decubitus Prep: Betadine Patient monitoring: heart rate, cardiac monitor, continuous pulse ox and blood pressure Approach: right paramedian Location: L3-4 Injection technique: single-shot Needle Needle type: Spinocan  Needle gauge: 22 G Needle length: 9 cm Assessment Sensory level: T8 Additional Notes ATTEMPTS:1 TRAY ID: [id number] TRAY EXPIRATION DATE:12/2012

## 2012-05-18 LAB — TYPE AND SCREEN: Unit division: 0

## 2012-05-18 LAB — CBC WITH DIFFERENTIAL/PLATELET
Eosinophils Relative: 0 % (ref 0–5)
HCT: 41.4 % (ref 39.0–52.0)
Hemoglobin: 14.1 g/dL (ref 13.0–17.0)
Lymphocytes Relative: 12 % (ref 12–46)
Lymphs Abs: 1.3 10*3/uL (ref 0.7–4.0)
MCV: 96.7 fL (ref 78.0–100.0)
Monocytes Absolute: 1.2 10*3/uL — ABNORMAL HIGH (ref 0.1–1.0)
RBC: 4.28 MIL/uL (ref 4.22–5.81)
WBC: 11.2 10*3/uL — ABNORMAL HIGH (ref 4.0–10.5)

## 2012-05-18 LAB — BASIC METABOLIC PANEL
CO2: 26 mEq/L (ref 19–32)
Chloride: 98 mEq/L (ref 96–112)
Sodium: 132 mEq/L — ABNORMAL LOW (ref 135–145)

## 2012-05-18 MED ORDER — KCL IN DEXTROSE-NACL 30-5-0.45 MEQ/L-%-% IV SOLN
INTRAVENOUS | Status: DC
  Administered 2012-05-18 – 2012-05-20 (×4): via INTRAVENOUS
  Filled 2012-05-18 (×4): qty 1000

## 2012-05-18 MED ORDER — BIOTENE DRY MOUTH MT LIQD
15.0000 mL | Freq: Two times a day (BID) | OROMUCOSAL | Status: DC
  Administered 2012-05-18 – 2012-05-22 (×8): 15 mL via OROMUCOSAL

## 2012-05-18 MED ORDER — CEFAZOLIN SODIUM 1-5 GM-% IV SOLN
INTRAVENOUS | Status: AC
  Filled 2012-05-18: qty 50

## 2012-05-18 NOTE — Clinical Social Work Psychosocial (Signed)
Clinical Social Work Department BRIEF PSYCHOSOCIAL ASSESSMENT 05/18/2012  Patient:  Tim Williams, Tim Williams     Account Number:  [id number]     Admit date:  05/17/2012  Clinical Social Worker:  Nancie Neas  Date/Time:  05/18/2012 12:55 PM  Referred by:  Physician  Date Referred:  05/18/2012 Referred for  SNF Placement   Other Referral:   Interview type:  Patient Other interview type:    PSYCHOSOCIAL DATA Living Status:  ALONE Admitted from facility:   Level of care:   Primary support name:  Marzetta Merino Primary support relationship to patient:  CHILD, ADULT Degree of support available:   supportive per pt    CURRENT CONCERNS Current Concerns  Post-Acute Placement   Other Concerns:    SOCIAL WORK ASSESSMENT / PLAN CSW met with pt and pt's sons Thayer Ohm and [place] at bedside. Pt alert and oriented but has some difficulty with some information which he says is due to his medication. Pt lives alone and was working at [employer] until April when he began having health issues. He states he was still independent at home. He is post-op day 1. Pt reports he has support from siblings and his sons. He had planned to go to SNF following surgery. CSW discussed placement process and provided SNF list. Pt aware that not all facilities are in network. He states he has met his out of pocket maximum as of yesterday. CSW spoke with case manager, Boneta Lucks, at [facility] who reports SNF request has been sent and Marylene Land at [facility] states clinicals have been faxed. Boneta Lucks said it can take 72 business hours for decision. Pt and sons are aware that when stable, pt will need to d/c either home or private pay to SNF. He ambulated 22' today with SBA.   Assessment/plan status:  Psychosocial Support/Ongoing Assessment of Needs Other assessment/ plan:   Information/referral to community resources:   SNF list    PATIENT'S/FAMILY'S RESPONSE TO PLAN OF CARE: Pt and family requesting SNF at d/c, pending insurance  authorization. Bed request initiated as well as BCBS authorization. CSW to follow up. Per MD, probably ready Monday for d/c.        Derenda Fennel, LCSW [phone]

## 2012-05-18 NOTE — Progress Notes (Signed)
Physical Therapy Treatment Patient Details Name: Tim Williams MRN: [id number] DOB: 07-Jun-1951 Today's Date: 05/18/2012 Time: 1191-4782 PT Time Calculation (min): 47 min  PT Assessment / Plan / Recommendation Comments on Treatment Session  Pt was able to tolerate movement of the RLE much better today.  He does not have any significant improvement in knee ROM (18-46 deg) but transfers with less assist.  Able to ambulate 26' with walker and SBA.      Follow Up Recommendations        Does the patient have the potential to tolerate intense rehabilitation     Barriers to Discharge        Equipment Recommendations       Recommendations for Other Services    Frequency     Plan Frequency remains appropriate;Discharge plan remains appropriate    Precautions / Restrictions     Pertinent Vitals/Pain     Mobility  Bed Mobility Supine to Sit: 4: Min assist;HOB flat;With rails Sit to Supine: Not Tested (comment) Transfers Transfers: Stand Pivot Transfers Sit to Stand: 4: Min assist;With upper extremity assist;From bed Stand to Sit: 4: Min assist;To chair/3-in-1;With upper extremity assist Stand Pivot Transfers: 3: Mod assist Details for Transfer Assistance: pt is more able to tolerate motion of RLE...he has difficulty standing from sitting as he tries to pull up on wheeled walker which moves.Marland Kitchen.he was instructed in proper way to stand but had difficulty accepting this technique Ambulation/Gait Ambulation/Gait Assistance: 5: Supervision Ambulation Distance (Feet): 60 Feet Assistive device: Rolling walker Gait Pattern: Step-through pattern;Decreased hip/knee flexion - right;Decreased stance time - right    Exercises Total Joint Exercises Ankle Circles/Pumps: AROM;Both;10 reps;Supine Quad Sets: AROM;Both;10 reps;Supine Short Arc Quad: AAROM;Right;10 reps;Supine Heel Slides: AAROM;Right;10 reps;Supine Goniometric ROM: 18-46 deg, AA   PT Diagnosis:    PT Problem List:   PT  Treatment Interventions:     PT Goals Acute Rehab PT Goals PT Goal: Supine/Side to Sit - Progress: Progressing toward goal PT Goal: Sit to Stand - Progress: Progressing toward goal PT Goal: Stand to Sit - Progress: Progressing toward goal Pt will Ambulate: 51 - 150 feet;with rolling walker PT Goal: Ambulate - Progress: Updated due to goal met  Visit Information  Last PT Received On: 05/18/12    Subjective Data  Subjective: I need to go to the bathroom   Cognition       Balance     End of Session PT - End of Session Equipment Utilized During Treatment: Gait belt Activity Tolerance: Patient tolerated treatment well Patient left: in chair;with call bell/phone within reach Nurse Communication: Mobility status CPM Right Knee CPM Right Knee: Off (pt up in chair)   GP     [name] L 05/18/2012, 3:49 PM

## 2012-05-18 NOTE — Progress Notes (Signed)
Subjective: 1 Day Post-Op Procedure(s) (LRB): TOTAL KNEE ARTHROPLASTY (Right) Patient reports pain as 5 on 0-10 scale.    Objective: Vital signs in last 24 hours: Temp:  [97.3 F (36.3 C)-98.6 F (37 C)] 98.6 F (37 C) (12/06 6295) Pulse Rate:  [62-85] 83  (12/06 0638) Resp:  [18-22] 18  (12/06 0638) BP: (87-120)/(48-76) 118/73 mmHg (12/06 0638) SpO2:  [92 %-100 %] 95 % (12/06 0721) Weight:  [113.127 kg (249 lb 6.4 oz)] 113.127 kg (249 lb 6.4 oz) (12/05 1155)  Intake/Output from previous day: 12/05 0701 - 12/06 0700 In: 1425 [P.O.:240; I.V.:1100] Out: 1960 [Urine:1225; Drains:685; Blood:50] Intake/Output this shift:     Basename 05/18/12 0450  HGB 14.1    Basename 05/18/12 0450  WBC 11.2*  RBC 4.28  HCT 41.4  PLT 192    Basename 05/18/12 0450  NA 132*  K 3.2*  CL 98  CO2 26  BUN 11  CREATININE 0.71  GLUCOSE 159*  CALCIUM 8.6   No results found for this basename: LABPT:2,INR:2 in the last 72 hours  Neurologically intact Neurovascular intact Sensation intact distally Intact pulses distally Dorsiflexion/Plantar flexion intact  He had a restless night last night.    I will supplement potassium today.  Begin PT today.  Assessment/Plan: 1 Day Post-Op Procedure(s) (LRB): TOTAL KNEE ARTHROPLASTY (Right) Up with therapy  Lyllie Cobbins 05/18/2012, 8:06 AM

## 2012-05-18 NOTE — Clinical Social Work Placement (Signed)
Clinical Social Work Department CLINICAL SOCIAL WORK PLACEMENT NOTE 05/18/2012  Patient:  Tim Williams, Tim Williams  Account Number:  [id number] Admit date:  05/17/2012  Clinical Social Worker:  Santa Genera, CLINICAL SOCIAL WORKER  Date/time:  05/17/2012 12:00 N  Clinical Social Work is seeking post-discharge placement for this patient at the following level of care:   SKILLED NURSING   (*CSW will update this form in Epic as items are completed)   05/18/2012  Patient/family provided with [facility] Department of Clinical Social Work's list of facilities offering this level of care within the geographic area requested by the patient (or if unable, by the patient's family).  05/18/2012  Patient/family informed of their freedom to choose among providers that offer the needed level of care, that participate in Medicare, Medicaid or managed care program needed by the patient, have an available bed and are willing to accept the patient.  05/18/2012  Patient/family informed of [facility]' ownership interest in [facility], as well as of the fact that they are under no obligation to receive care at this facility.  PASARR submitted to EDS on 05/17/2012 PASARR number received from EDS on 05/17/2012  FL2 transmitted to all facilities in geographic area requested by pt/family on  05/18/2012 FL2 transmitted to all facilities within larger geographic area on 05/18/2012  Patient informed that his/her managed care company has contracts with or will negotiate with  certain facilities, including the following:     Patient/family informed of bed offers received:   Patient chooses bed at  Physician recommends and patient chooses bed at    Patient to be transferred to  on   Patient to be transferred to facility by   The following physician request were entered in Epic:   Additional Comments: Patient immediately post surgery.  Has BCBS PPO.  Will need to verify insurance coverage for SNF.  Per  MD, patient is requesting SNF placement at discharge.  Derenda Fennel, [place] [phone]

## 2012-05-18 NOTE — Op Note (Signed)
Tim Williams, Tim Williams                ACCOUNT NO.:  [id number]  MEDICAL RECORD NO.:  [id number]  LOCATION:  A309                          FACILITY:  [facility]  PHYSICIAN:  J. Darreld Mclean, M.D. DATE OF BIRTH:  1951/02/27  DATE OF PROCEDURE:  05/17/2012 DATE OF DISCHARGE:                              OPERATIVE REPORT   PREOPERATIVE DIAGNOSIS:  Severe degenerative joint disease of the right knee with deformity.  POSTOPERATIVE DIAGNOSIS:  Severe degenerative joint disease of the right knee with deformity.  PROCEDURE:  Smith and Nephew total knee arthroplasty using a size 6 right posterior stabilized Legion Oxinium femoral component, a genesis 2 right nonporous tibial base plate, size 6, a 11 mm Legion PS XLPE high flexion articular insert, and a 29 mm, 7.5 mm thick genesis resurfacing patellar component.  I used methylmethacrylate to hold it in place.  ANESTHESIA:  Spinal.  SURGEON:  J. Darreld Mclean, MD.  ASSISTANTMarlinda Mike and [place].  DRAINS:  One large Hemovac drain.  INDICATIONS:  The patient has severe degenerative joint disease of the right knee that has been present for many years, getting progressively worse.  He has tried conservative treatment, rest, anti-inflammatories, cane, medications without success.  He has gotten to the point where he can hardly walk, was having severe pain even at rest.  He saw me at the request of his family doctor, Dr. Lysbeth Williams.  I recommended total knee arthroplasty.  I gave ED consideration.  I went over the risks and imponderables of the procedure preoperatively and he appeared to understand and agreed with the procedure as outlined.  The patient has had MRIs of his knee to accurately ascertain the significance of the disease and also for the orthopedic supply company to make more precise fitting in cutting blocks so that we can do the procedure.  The patient has marked deformity of the knee.  It was a difficult case and more complicated case  than usual.  The patient was seen in the holding area.  The right knee was identified as the correct surgical site.  I placed a mark on the right knee.  The patient received antibiotics 1 g of Ancef preoperatively prior to the surgery.  Postoperatively, he will get enoxaparin and prophylaxis for possible thrombosis, enoxaparin and the use of sequential suppression hose.  The patient was brought to the operating room, given spinal anesthesia. Foley was placed.  A tourniquet was placed, deflated right upper thigh. Appropriate holding blocks were placed.  He was prepped and draped in usual manner.  A generalized time-out identifying the patient as Tim Williams, we are doing a right total knee arthroplasty.  All instrumentation was in the room were heard to be used.  OR team knew each other.  The right knee is the correct surgical site.  His leg was elevated, wrapped circumferentially with Esmarch bandage and tourniquet inflated to 300 mmHg.  Esmarch bandage removed.  Midline incision was made.  A medial parapatellar incision was made.  The patient's patella was everted.  The patient had marked deformity of the knee.  Had significant eburnation of bone, but only a few loose bodies. Using first femoral pre-prepared cutting block,  this was placed and the cuts were made.  The knee was very tight and due to deformity of his lower leg, had to loosen it up slightly, more medially.  I did a femoral cutting block to do the posterior chamfer and anterior chamfer.  I used size 6 block which had previously decided by the MRI and also by the examination today and measurements.  Once these cuts were made, attention directed to the tibia.  He had significant degenerative joint disease.  Cutting block was applied.  Put the spacer in, the knee was tight, still, and I elected to take off 2 more on the femur and this was difficult to do because the previous marks had gone after doing the chamfer cut.  We  reestablished this and did the next 2 mm for the femoral side and then we did a next 2 mm for the tibia side and this gave good spacing to fit the 11 mm block with good flexion and extension and not excessive tightness or looseness.  The tibia was sized, #6 fit nicely.  Drill was used and that had been marked in appropriate position after doing flexion-extension and then the tibial component was prepared and drilled and had the wing osteotome used.  Attention directed to the patella.  Patella measured 25 mm.  I wanted to get down less 9 mm.  I made a cut 16 mm using oscillating saw.  This was confirmed with the calipers.  Drill holes were made to fit the patella button.  Trial was carried out from the trial prosthesis to fit very nicely.  By this time, tourniquet time had exceeded an hour due to the other extra cuts we had to make because of the deformity and the length of time to gently release the medial part of the knee.  Methylmethacrylate was mixed under vacuum, put in a caulk gun and then first tibial component while the substances were used and Waterpik lavage for the knee.  The tibial component was placed first, several femoral component.  Tibial wafer placed and the femoral component was placed.  Glue set up and insert component was removed and a permanent size 11 was inserted without difficulty.  Knee was reduced. Range of motion was carried out very nicely.  Knee was stable. Alignment looked good.  I then placed the Hemovac, sewn with 2-0 silk suture.  Fascial layer was reapproximated using interrupted figure-of- eight #1 Surgilon suture.  Tourniquet was then deflated at this point. It was 96 minutes.  X-rays were taken in [facility] and lateral views.  These looked good.  Fascial layer reapproximated using 2-0 plain in layers and skin reapproximated using skin staples.  Sterile dressing applied. Bulky dressing applied.  He will have a cryo cuff applied in the PACU and also have to be on  a CPM machine.  He tolerated the procedure well.          ______________________________ J. Darreld Mclean, M.D.     JWK/MEDQ  D:  05/17/2012  T:  05/18/2012  Job:  [id number]  cc:   Tim Williams, M.D. Fax: [phone]

## 2012-05-19 LAB — CBC WITH DIFFERENTIAL/PLATELET
Eosinophils Absolute: 0.1 10*3/uL (ref 0.0–0.7)
Eosinophils Relative: 1 % (ref 0–5)
Lymphs Abs: 1.5 10*3/uL (ref 0.7–4.0)
MCH: 32.8 pg (ref 26.0–34.0)
MCV: 96.4 fL (ref 78.0–100.0)
Monocytes Absolute: 0.9 10*3/uL (ref 0.1–1.0)
Platelets: 193 10*3/uL (ref 150–400)
RBC: 4.15 MIL/uL — ABNORMAL LOW (ref 4.22–5.81)

## 2012-05-19 LAB — BASIC METABOLIC PANEL
CO2: 26 mEq/L (ref 19–32)
Calcium: 8.8 mg/dL (ref 8.4–10.5)
Creatinine, Ser: 0.73 mg/dL (ref 0.50–1.35)
Glucose, Bld: 146 mg/dL — ABNORMAL HIGH (ref 70–99)
Sodium: 135 mEq/L (ref 135–145)

## 2012-05-19 MED ORDER — ALBUTEROL SULFATE (5 MG/ML) 0.5% IN NEBU
2.5000 mg | INHALATION_SOLUTION | Freq: Three times a day (TID) | RESPIRATORY_TRACT | Status: DC
  Administered 2012-05-20 – 2012-05-22 (×6): 2.5 mg via RESPIRATORY_TRACT
  Filled 2012-05-19 (×7): qty 0.5

## 2012-05-19 MED ORDER — ALBUTEROL SULFATE (5 MG/ML) 0.5% IN NEBU: 2.5000 mg | INHALATION_SOLUTION | RESPIRATORY_TRACT | Status: DC | PRN

## 2012-05-19 NOTE — Plan of Care (Signed)
Problem: Phase II Progression Outcomes Goal: Progress activity as tolerated unless otherwise ordered Outcome: Progressing Walked to bathroom with walker with two assisting

## 2012-05-19 NOTE — Progress Notes (Signed)
Subjective: 2 Days Post-Op Procedure(s) (LRB): TOTAL KNEE ARTHROPLASTY (Right) Patient reports pain as 4 on 0-10 scale.    Objective: Vital signs in last 24 hours: Temp:  [98.2 F (36.8 C)-98.7 F (37.1 C)] 98.2 F (36.8 C) (12/07 0651) Pulse Rate:  [85-95] 95  (12/07 0651) Resp:  [13-24] 18  (12/07 0651) BP: (118-142)/(65-79) 120/76 mmHg (12/07 0651) SpO2:  [91 %-98 %] 98 % (12/07 0651)  Intake/Output from previous day: 12/06 0701 - 12/07 0700 In: 1788.3 [P.O.:960; I.V.:778.3; IV Piggyback:50] Out: 2695 [Urine:2450; Drains:245] Intake/Output this shift:     Basename 05/19/12 0629 05/18/12 0450  HGB 13.6 14.1    Basename 05/19/12 0629 05/18/12 0450  WBC 11.6* 11.2*  RBC 4.15* 4.28  HCT 40.0 41.4  PLT 193 192    Basename 05/19/12 0629 05/18/12 0450  NA 135 132*  K 3.7 3.2*  CL 100 98  CO2 26 26  BUN 9 11  CREATININE 0.73 0.71  GLUCOSE 146* 159*  CALCIUM 8.8 8.6   No results found for this basename: LABPT:2,INR:2 in the last 72 hours  Neurologically intact Neurovascular intact Sensation intact distally Intact pulses distally Dorsiflexion/Plantar flexion intact Incision: scant drainage No cellulitis present Compartment soft  He did fair in PT yesterday.  He has been out of bed today by himself and his pain is less.  Labs are good.  Assessment/Plan: 2 Days Post-Op Procedure(s) (LRB): TOTAL KNEE ARTHROPLASTY (Right) Up with therapy  Ryin Schillo 05/19/2012, 9:04 AM

## 2012-05-19 NOTE — Plan of Care (Signed)
Problem: Phase II Progression Outcomes Goal: Progress activity as tolerated unless otherwise ordered Outcome: Completed/Met Date Met:  05/19/12 Patient ambulating with walker to BR - sitting up in chair

## 2012-05-19 NOTE — Progress Notes (Signed)
Patient told nurse he really wanted to get up to the bathroom. Nurse and Nurse Tech used walker and helped patient walk to the bathroom. Patient tolerated very well.

## 2012-05-19 NOTE — Progress Notes (Signed)
Physical Therapy Treatment Patient Details Name: Tim Williams MRN: [id number] DOB: 22-Jun-1950 Today's Date: 05/19/2012 Time: 1310-1350 PT Time Calculation (min): 40 min  PT Assessment / Plan / Recommendation Comments on Treatment Session  Pt was able to perform all functional mobility/activities today with ROM towards R knee flexion currently at 12-45deg. CPM not in use prior to therapy. Patient has showed improvement on bed mobility activities at [place] to [facility] assist; showed more carryover during proper techniques during sit to stand activities prior to ambulation. Patient ambulated using a RW for 31feet at [facility] with noted antalgic gait. TTWB encourage for joint protection at this time. Family members present during tx.     Follow Up Recommendations  SNF     Does the patient have the potential to tolerate intense rehabilitation     Barriers to Discharge        Equipment Recommendations       Recommendations for Other Services    Frequency 7X/week   Plan Discharge plan remains appropriate    Precautions / Restrictions Precautions Precautions: Knee Precaution Booklet Issued: No Restrictions Weight Bearing Restrictions: Yes RLE Weight Bearing: Touchdown weight bearing       Mobility  Bed Mobility Bed Mobility: Supine to Sit Supine to Sit: 4: Min guard Sit to Supine: 4: Min guard Transfers Transfers: Sit to Stand;Stand to Sit Sit to Stand: 4: Min assist;With upper extremity assist;From bed Stand to Sit: 4: Min guard;To bed Stand Pivot Transfers: 4: Min guard Ambulation/Gait Ambulation/Gait Assistance: 5: Supervision Assistive device: Rolling walker Gait Pattern: Decreased stance time - right;Decreased step length - left;Antalgic    Exercises General Exercises - Lower Extremity Ankle Circles/Pumps: AROM;20 reps;Both;Supine Quad Sets: AROM;Right;20 reps;Supine Gluteal Sets: AROM;20 reps;Supine Heel Slides: AROM;20 reps;Supine;AAROM Hip  ABduction/ADduction: AROM;AAROM;20 reps;Supine   PT Diagnosis:    PT Problem List:   PT Treatment Interventions:     PT Goals Acute Rehab PT Goals PT Goal Formulation: With patient Time For Goal Achievement: 05/31/12 Potential to Achieve Goals: Good Pt will go Supine/Side to Sit: with supervision PT Goal: Supine/Side to Sit - Progress: Progressing toward goal Pt will go Sit to Supine/Side: with supervision PT Goal: Sit to Supine/Side - Progress: Progressing toward goal Pt will go Sit to Stand: with supervision PT Goal: Sit to Stand - Progress: Progressing toward goal Pt will go Stand to Sit: with supervision PT Goal: Stand to Sit - Progress: Progressing toward goal Pt will Ambulate: 51 - 150 feet;with supervision;with rolling walker PT Goal: Ambulate - Progress: Progressing toward goal  Visit Information  Last PT Received On: 05/19/12    Subjective Data  Subjective: I would like to walk.    Cognition  Overall Cognitive Status: Appears within functional limits for tasks assessed/performed Arousal/Alertness: Awake/alert Orientation Level: Appears intact for tasks assessed Behavior During Session: [facility] for tasks performed    Balance     End of Session PT - End of Session Equipment Utilized During Treatment: Gait belt Activity Tolerance: Patient tolerated treatment well Patient left: in bed;with call bell/phone within reach;with nursing in room;with family/visitor present Nurse Communication: Mobility status CPM Right Knee CPM Right Knee: Off   GP     Jamyah Folk, Larna Daughters 05/19/2012, 2:38 PM

## 2012-05-20 LAB — BASIC METABOLIC PANEL
BUN: 15 mg/dL (ref 6–23)
Calcium: 8.8 mg/dL (ref 8.4–10.5)
Creatinine, Ser: 0.77 mg/dL (ref 0.50–1.35)
GFR calc non Af Amer: >90 mL/min (ref >90)
Glucose, Bld: 137 mg/dL — ABNORMAL HIGH (ref 70–99)
Sodium: 134 mEq/L — ABNORMAL LOW (ref 135–145)

## 2012-05-20 LAB — CBC WITH DIFFERENTIAL/PLATELET
Basophils Relative: 0 % (ref 0–1)
Eosinophils Absolute: 0.2 10*3/uL (ref 0.0–0.7)
Eosinophils Relative: 2 % (ref 0–5)
MCH: 32.6 pg (ref 26.0–34.0)
MCHC: 33.5 g/dL (ref 30.0–36.0)
Neutrophils Relative %: 76 % (ref 43–77)
Platelets: 223 10*3/uL (ref 150–400)
RDW: 13.4 % (ref 11.5–15.5)

## 2012-05-20 MED ORDER — KCL IN DEXTROSE-NACL 30-5-0.45 MEQ/L-%-% IV SOLN
INTRAVENOUS | Status: AC
  Filled 2012-05-20: qty 1000

## 2012-05-20 NOTE — Progress Notes (Signed)
Physical Therapy Treatment Patient Details Name: Tim Williams MRN: [id number] DOB: Sep 28, 1950 Today's Date: 05/20/2012 Time: 7829-5621 PT Time Calculation (min): 40 min  PT Assessment / Plan / Recommendation Comments on Treatment Session  Patient doing consistently well during functional mobility performance. Patient's ROM towards knee flexion R currently at 52deg (measured while sitting). Patient was very motivated to partiipate and get better and showed good carryover of tasks. Occasional cueing given for WB status reminder to RLE during transfer activities.     Follow Up Recommendations        Does the patient have the potential to tolerate intense rehabilitation     Barriers to Discharge        Equipment Recommendations       Recommendations for Other Services    Frequency 7X/week   Plan Discharge plan remains appropriate    Precautions / Restrictions Precautions Precautions: Knee Restrictions Weight Bearing Restrictions: Yes RLE Weight Bearing: Touchdown weight bearing       Mobility  Bed Mobility Bed Mobility: Supine to Sit;Sitting - Scoot to Edge of Bed Supine to Sit: 4: Min guard Sitting - Scoot to [place] of Bed: 4: Min guard (pt was given cues for proper sequencing/segmentation of task) Transfers Transfers: Sit to Stand;Stand to [employer] Transfers Sit to Stand: 4: Min assist;From bed Stand to Sit: 4: Min guard Stand Pivot Transfers: 4: Min guard    Exercises General Exercises - Lower Extremity Ankle Circles/Pumps: AROM;10 reps;Supine Quad Sets: AROM;10 reps;Supine Short Arc Quad: AAROM;AROM;10 reps;20 reps;Both;Supine Long Arc Quad: AROM;Left;20 reps;Seated Heel Slides: AAROM;AROM;10 reps;Supine Toe Raises: AROM;Both;20 reps;Seated Heel Raises: AROM;Both;20 reps;Seated   PT Diagnosis:    PT Problem List:   PT Treatment Interventions:     PT Goals Acute Rehab PT Goals PT Goal Formulation: With patient Time For Goal Achievement:  05/31/12 Potential to Achieve Goals: Good Pt will go Supine/Side to Sit: with supervision PT Goal: Supine/Side to Sit - Progress: Progressing toward goal Pt will go Sit to Supine/Side: with supervision PT Goal: Sit to Supine/Side - Progress: Progressing toward goal Pt will go Sit to Stand: with supervision PT Goal: Sit to Stand - Progress: Progressing toward goal Pt will go Stand to Sit: with supervision PT Goal: Stand to Sit - Progress: Progressing toward goal Pt will Ambulate: 51 - 150 feet;with supervision;with rolling walker PT Goal: Ambulate - Progress: Progressing toward goal  Visit Information  Last PT Received On: 05/20/12    Subjective Data  Patient Stated Goal: Be able to walk again    Cognition  Overall Cognitive Status: Appears within functional limits for tasks assessed/performed Arousal/Alertness: Awake/alert Orientation Level: Appears intact for tasks assessed Behavior During Session: [facility] for tasks performed    Balance     End of Session PT - End of Session Equipment Utilized During Treatment: Gait belt Activity Tolerance: Patient tolerated treatment well Patient left: in chair;with call bell/phone within reach;with family/visitor present Nurse Communication: Mobility status   GP     Giannah Zavadil, Larna Daughters 05/20/2012, 1:03 PM

## 2012-05-20 NOTE — Progress Notes (Signed)
Subjective: 3 Days Post-Op Procedure(s) (LRB): TOTAL KNEE ARTHROPLASTY (Right) Patient reports pain as 3 on 0-10 scale.    Objective: Vital signs in last 24 hours: Temp:  [98.4 F (36.9 C)-99.9 F (37.7 C)] 98.7 F (37.1 C) (12/08 0647) Pulse Rate:  [90-100] 93  (12/08 0647) Resp:  [14-24] 16  (12/08 0901) BP: (96-109)/(52-69) 109/67 mmHg (12/08 0647) SpO2:  [91 %-98 %] 98 % (12/08 0901)  Intake/Output from previous day: 12/07 0701 - 12/08 0700 In: 2090 [P.O.:840; I.V.:1250] Out: -  Intake/Output this shift:     Basename 05/20/12 0555 05/19/12 0629 05/18/12 0450  HGB 12.5* 13.6 14.1    Basename 05/20/12 0555 05/19/12 0629  WBC 9.7 11.6*  RBC 3.83* 4.15*  HCT 37.3* 40.0  PLT 223 193    Basename 05/20/12 0555 05/19/12 0629  NA 134* 135  K 3.9 3.7  CL 99 100  CO2 27 26  BUN 15 9  CREATININE 0.77 0.73  GLUCOSE 137* 146*  CALCIUM 8.8 8.8   No results found for this basename: LABPT:2,INR:2 in the last 72 hours  Neurologically intact Neurovascular intact Sensation intact distally Intact pulses distally Dorsiflexion/Plantar flexion intact Incision: scant drainage No cellulitis present Compartment soft  Assessment/Plan: 3 Days Post-Op Procedure(s) (LRB): TOTAL KNEE ARTHROPLASTY (Right) Up with therapy.  I would hope we can make arrangements for discharge to SNF in next several days.  Taylie Helder 05/20/2012, 10:14 AM

## 2012-05-21 MED ORDER — OXYCODONE-ACETAMINOPHEN 5-325 MG PO TABS
1.0000 | ORAL_TABLET | ORAL | Status: DC | PRN
  Administered 2012-05-21 (×2): 1 via ORAL
  Filled 2012-05-21 (×2): qty 1

## 2012-05-21 MED ORDER — MORPHINE SULFATE 4 MG/ML IJ SOLN: 4.0000 mg | INTRAMUSCULAR | Status: DC | PRN

## 2012-05-21 NOTE — Clinical Social Work Note (Signed)
Per CSW [place], patient has decided to return home at discharge and no longer desires SNF placement.  Potential receiving SNF, [facility] and [facility] notified of patient choice.  CSW signing off on patient unless further needs arise.  Santa Genera, LCSW Clinical Social Worker [phone])

## 2012-05-21 NOTE — Care Management Note (Signed)
    Page 1 of 2   05/22/2012     11:48:49 AM   CARE MANAGEMENT NOTE 05/22/2012  Patient:  Tim Williams, Tim Williams   Account Number:  [id number]  Date Initiated:  05/21/2012  Documentation initiated by:  Sharrie Rothman  Subjective/Objective Assessment:   Pt admitted from home s/p right total knee. Pt lives alone but has 2 sons who are active in the care of the pt. Pt will return home at discharge with [facility]. Pt has walker, cane, and BSC for home use. CSW involved.     Action/Plan:   Pt chose [facility] for HH. No DME needs at this time. CM will arrange [facility] per MD orders.   Anticipated DC Date:  05/22/2012   Anticipated DC Plan:  HOME W HOME HEALTH SERVICES  In-house referral  Clinical Social Worker      DC Planning Services  CM consult      [facility] Choice  HOME HEALTH   Choice offered to / List presented to:  C-1 Patient        HH arranged  HH-1 RN  HH-2 PT      [facility] agency  [facility]   Status of service:  Completed, signed off Medicare Important Message given?   (If response is "NO", the following Medicare IM given date fields will be blank) Date Medicare IM given:   Date Additional Medicare IM given:    Discharge Disposition:  HOME W HOME HEALTH SERVICES  Per UR Regulation:    If discussed at Long Length of Stay Meetings, dates discussed:   05/22/2012    Comments:  05/22/12 1145 Arlyss Queen, RN BSN CM Pt discharged home today with [facility] RN and PT. Alroy Bailiff of [facility] is aware of discharge arrangement and will collect the pts information from the chart. No DME needs noted. Pt and pts nurse aware of discharge arrangements.  05/21/12 1400 Arlyss Queen, RN BSN CM

## 2012-05-21 NOTE — Progress Notes (Signed)
Physical Therapy Treatment Patient Details Name: Tim Williams MRN: [id number] DOB: [birth year] Today's Date: 05/21/2012 Time: 4098-1191 PT Time Calculation (min): 37 min  PT Assessment / Plan / Recommendation Comments on Treatment Session  Pt has progressed exceedingly well since last week.  He is now independent with transfers in and out of bed, is able to walker with walker 165', cues needed to slow his pace down.  Knee ROM is 4-72 deg.  He states that he feels able to go home at discharge and children will be able to assist as needed.  My recommendation is for him to go home.    Follow Up Recommendations  Home health PT     Does the patient have the potential to tolerate intense rehabilitation     Barriers to Discharge        Equipment Recommendations       Recommendations for Other Services    Frequency     Plan Discharge plan needs to be updated    Precautions / Restrictions Restrictions Weight Bearing Restrictions: Yes RLE Weight Bearing: Touchdown weight bearing   Pertinent Vitals/Pain     Mobility  Bed Mobility Supine to Sit: 7: Independent;HOB flat Sitting - Scoot to Edge of Bed: 7: Independent Transfers Sit to Stand: 6: Modified independent (Device/Increase time) Stand to Sit: 6: Modified independent (Device/Increase time) Ambulation/Gait Ambulation/Gait Assistance: 5: Supervision Ambulation Distance (Feet): 165 Feet Assistive device: Rolling walker Gait Pattern: Decreased stance time - right Gait velocity: too rapid...needs cues to slow down Stairs: No Wheelchair Mobility Wheelchair Mobility: No    Exercises Total Joint Exercises Ankle Circles/Pumps: AROM;Both;10 reps;Supine Quad Sets: AROM;Both;10 reps;Supine Short Arc Quad: AAROM;Right;10 reps;Supine Heel Slides: AAROM;Right;10 reps;Supine Knee Flexion: AAROM;Right;10 reps;Seated Goniometric ROM: 4-72 deg   PT Diagnosis:    PT Problem List:   PT Treatment Interventions:     PT Goals Acute  Rehab PT Goals PT Goal: Supine/Side to Sit - Progress: Met PT Goal: Sit to Stand - Progress: Met PT Goal: Stand to Sit - Progress: Met PT Goal: Ambulate - Progress: Met  Visit Information  Last PT Received On: 05/21/12    Subjective Data  Subjective: I'm feeling a whole lot better   Cognition       Balance     End of Session PT - End of Session Equipment Utilized During Treatment: Gait belt Activity Tolerance: Patient tolerated treatment well Patient left: in chair;with call bell/phone within reach CPM Right Knee CPM Right Knee: Off   GP     [name] L 05/21/2012, 10:11 AM

## 2012-05-21 NOTE — Progress Notes (Signed)
Subjective: 4 Days Post-Op Procedure(s) (LRB): TOTAL KNEE ARTHROPLASTY (Right) Patient reports pain as 3 on 0-10 scale.    Objective: Vital signs in last 24 hours: Temp:  [98.1 F (36.7 C)-98.7 F (37.1 C)] 98.1 F (36.7 C) (12/09 0538) Pulse Rate:  [81-93] 82  (12/09 0538) Resp:  [12-20] 19  (12/09 0538) BP: (100-137)/(64-76) 114/70 mmHg (12/09 0538) SpO2:  [92 %-98 %] 92 % (12/09 0538)  Intake/Output from previous day: 12/08 0701 - 12/09 0700 In: 2110 [P.O.:960; I.V.:1150] Out: 875 [Urine:875] Intake/Output this shift:     Basename 05/20/12 0555 05/19/12 0629  HGB 12.5* 13.6    Basename 05/20/12 0555 05/19/12 0629  WBC 9.7 11.6*  RBC 3.83* 4.15*  HCT 37.3* 40.0  PLT 223 193    Basename 05/20/12 0555 05/19/12 0629  NA 134* 135  K 3.9 3.7  CL 99 100  CO2 27 26  BUN 15 9  CREATININE 0.77 0.73  GLUCOSE 137* 146*  CALCIUM 8.8 8.8   No results found for this basename: LABPT:2,INR:2 in the last 72 hours  Neurologically intact Neurovascular intact Sensation intact distally Intact pulses distally Dorsiflexion/Plantar flexion intact Incision: scant drainage No cellulitis present Compartment soft  PCA discontinued.    Plan SNF discharge ? Tomorrow.  Assessment/Plan: 4 Days Post-Op Procedure(s) (LRB): TOTAL KNEE ARTHROPLASTY (Right) Up with therapy  Tim Williams 05/21/2012, 7:48 AM

## 2012-05-22 MED ORDER — ENOXAPARIN SODIUM 40 MG/0.4ML ~~LOC~~ SOLN: 40.0000 mg | SUBCUTANEOUS | Status: DC

## 2012-05-22 MED ORDER — OXYCODONE-ACETAMINOPHEN 5-325 MG PO TABS: 1.0000 | ORAL_TABLET | ORAL | Status: DC | PRN

## 2012-05-22 NOTE — Progress Notes (Signed)
UR chart review completed.  

## 2012-05-22 NOTE — Discharge Summary (Signed)
Physician Discharge Summary  Patient ID: Tim Williams MRN: [id number] DOB/AGE: 1951-02-23 61 y.o.  Admit date: 05/17/2012 Discharge date: 05/22/2012  Admission Diagnoses: Severe Degenerative Joint Disease of the Right knee  Discharge Diagnoses: Severe Degenerative Joint Disease of the Right Kee Active Problems:  * No active hospital problems. *    Discharged Condition: good  Hospital Course: He was admitted to the hospital following total knee replacement on the right.  He had a CPM machine applied and had it for two days.  On the second day the CPM was removed as were the foley and the hemovac drain.  He began Physical Therapy the day after surgery and has progressed well with the therapy.  His wound looks good with only scant drainage.  He has remained afebrile.  His pain has been well controlled.  He has had enoxaparin and will continue this at home.  Home health has been arranged.  He is well motivated.  Consults: None  Significant Diagnostic Studies: labs: normal and radiology: X-Ray: right knee total knee replacement.  Treatments: analgesia: Morphine; total knee replacement on the right as his surgery.  Discharge Exam: Blood pressure 118/63, pulse 83, temperature 98.3 F (36.8 C), temperature source Oral, resp. rate 20, height 5\' 8"  (1.727 m), weight 113.127 kg (249 lb 6.4 oz), SpO2 96.00%. General appearance: alert and cooperative.  His wound looks good.  His vital signs are normal.  Disposition: 01-Home or Self Care  Discharge Orders    Future Orders Please Complete By Expires   Diet - low sodium heart healthy      Call MD / Call 911      Comments:   If you experience chest pain or shortness of breath, CALL 911 and be transported to the hospital emergency room.  If you develope a fever above 101 F, pus (white drainage) or increased drainage or redness at the wound, or calf pain, call your surgeon's office.   Constipation Prevention      Comments:   Drink plenty of  fluids.  Prune juice may be helpful.  You may use a stool softener, such as Colace (over the counter) 100 mg twice a day.  Use MiraLax (over the counter) for constipation as needed.   Increase activity slowly as tolerated      Discharge instructions      Comments:   Keep wound dry.  Continue exercises as shown by therapy. Home health to come for physical therapy and injections of enoxaparin. Enoxaparin to be given daily for one month.  Staples to be removed December 16, steri-strip wound.  Make appointment to see Dr. Hilda Lias in two weeks.  Call [phone] if any problem or if after hours, call the hospital [phone].       Medication List     As of 05/22/2012  7:43 AM    TAKE these medications         enoxaparin 40 MG/0.4ML injection   Commonly known as: LOVENOX   Inject 0.4 mLs (40 mg total) into the skin daily.      oxyCODONE-acetaminophen 5-325 MG per tablet   Commonly known as: PERCOCET/ROXICET   Take 1 tablet by mouth every 4 (four) hours as needed for pain.      valsartan-hydrochlorothiazide 160-12.5 MG per tablet   Commonly known as: DIOVAN-HCT   Take 1 tablet by mouth daily.           Follow-up Information    Follow up with Darreld Mclean, MD. Schedule an appointment  as soon as possible for a visit in 2 weeks.   Contact information:   [address] [place] [address] [phone]          Signed: Darreld Mclean 05/22/2012, 7:43 AM

## 2012-05-22 NOTE — Progress Notes (Signed)
Subjective: 5 Days Post-Op Procedure(s) (LRB): TOTAL KNEE ARTHROPLASTY (Right) Patient reports pain as 2 on 0-10 scale.    Objective: Vital signs in last 24 hours: Temp:  [98.3 F (36.8 C)-99 F (37.2 C)] 98.3 F (36.8 C) (12/10 0521) Pulse Rate:  [77-83] 83  (12/10 0521) Resp:  [20] 20  (12/10 0521) BP: (118-119)/(63-67) 118/63 mmHg (12/10 0521) SpO2:  [93 %-97 %] 96 % (12/10 0521) FiO2 (%):  [94 %] 94 % (12/10 0650)  Intake/Output from previous day: 12/09 0701 - 12/10 0700 In: 240 [P.O.:240] Out: -  Intake/Output this shift:     Basename 05/20/12 0555  HGB 12.5*    Basename 05/20/12 0555  WBC 9.7  RBC 3.83*  HCT 37.3*  PLT 223    Basename 05/20/12 0555  NA 134*  K 3.9  CL 99  CO2 27  BUN 15  CREATININE 0.77  GLUCOSE 137*  CALCIUM 8.8   No results found for this basename: LABPT:2,INR:2 in the last 72 hours  Neurologically intact Neurovascular intact Sensation intact distally Intact pulses distally Dorsiflexion/Plantar flexion intact Incision: scant drainage No cellulitis present Compartment soft  Assessment/Plan: 5 Days Post-Op Procedure(s) (LRB): TOTAL KNEE ARTHROPLASTY (Right) Discharge home with home health  Tim Williams 05/22/2012, 7:34 AM

## 2012-05-22 NOTE — Progress Notes (Signed)
Physical Therapy Treatment Patient Details Name: Tim Williams MRN: [id number] DOB: 09-26-1950 Today's Date: 05/22/2012 Time: 9562-1308 PT Time Calculation (min): 54 min  PT Assessment / Plan / Recommendation Comments on Treatment Session  Pt has had more difficulty with knee flexion this afternoon.  I attempted to stretch the knee into flexion, but he could not tolerate this.  He was instructed in self exercise for flexion using a sheet.  Gait is stable with a walker.    Follow Up Recommendations  Home health PT     Does the patient have the potential to tolerate intense rehabilitation     Barriers to Discharge        Equipment Recommendations       Recommendations for Other Services    Frequency     Plan All goals met and education completed, patient dischaged from PT services    Precautions / Restrictions     Pertinent Vitals/Pain     Mobility  Bed Mobility Supine to Sit: 7: Independent Sitting - Scoot to Edge of Bed: 7: Independent Sit to Supine: 7: Independent Transfers Sit to Stand: 6: Modified independent (Device/Increase time);From bed;With upper extremity assist Stand to Sit: 6: Modified independent (Device/Increase time);With upper extremity assist;To bed Ambulation/Gait Ambulation/Gait Assistance: 6: Modified independent (Device/Increase time) Ambulation Distance (Feet): 160 Feet Assistive device: Rolling walker    Exercises Total Joint Exercises Ankle Circles/Pumps: AROM;Both;10 reps;Supine Quad Sets: AROM;Both;10 reps;Supine Short Arc Quad: AAROM;Right;10 reps;Supine Heel Slides: AAROM;Right;10 reps;Supine Knee Flexion: AAROM;Right;5 reps;Seated Goniometric ROM: 6-45 deg, AA   PT Diagnosis:    PT Problem List:   PT Treatment Interventions:     PT Goals Acute Rehab PT Goals PT Goal: Sit to Supine/Side - Progress: Met  Visit Information  Last PT Received On: 05/22/12    Subjective Data  Subjective: my knee will bend better when the staples  come out   Cognition       Balance     End of Session PT - End of Session Equipment Utilized During Treatment: Gait belt Activity Tolerance: Patient tolerated treatment well Patient left: in bed;with call bell/phone within reach;with bed alarm set CPM Right Knee CPM Right Knee: Off   GP     [name] L 05/22/2012, 2:44 PM

## 2012-05-23 ENCOUNTER — Encounter (HOSPITAL_COMMUNITY): Payer: Self-pay | Admitting: Orthopaedic Surgery

## 2012-06-08 NOTE — Addendum Note (Signed)
Addendum  created 06/08/12 0657 by Moshe Salisbury, CRNA   Modules edited:Anesthesia Events

## 2012-07-10 ENCOUNTER — Ambulatory Visit: Payer: BC Managed Care – PPO | Attending: Orthopaedic Surgery | Admitting: Physical Therapy

## 2012-07-10 DIAGNOSIS — IMO0001 Reserved for inherently not codable concepts without codable children: Secondary | ICD-10-CM | POA: Insufficient documentation

## 2012-07-10 DIAGNOSIS — M25569 Pain in unspecified knee: Secondary | ICD-10-CM | POA: Insufficient documentation

## 2012-07-10 DIAGNOSIS — R5381 Other malaise: Secondary | ICD-10-CM | POA: Insufficient documentation

## 2012-07-10 DIAGNOSIS — M25669 Stiffness of unspecified knee, not elsewhere classified: Secondary | ICD-10-CM | POA: Insufficient documentation

## 2012-07-11 ENCOUNTER — Encounter: Payer: BC Managed Care – PPO | Admitting: *Deleted

## 2012-07-16 ENCOUNTER — Ambulatory Visit: Payer: BC Managed Care – PPO | Attending: Orthopaedic Surgery | Admitting: Physical Therapy

## 2012-07-16 DIAGNOSIS — M25669 Stiffness of unspecified knee, not elsewhere classified: Secondary | ICD-10-CM | POA: Insufficient documentation

## 2012-07-16 DIAGNOSIS — IMO0001 Reserved for inherently not codable concepts without codable children: Secondary | ICD-10-CM | POA: Insufficient documentation

## 2012-07-16 DIAGNOSIS — M25569 Pain in unspecified knee: Secondary | ICD-10-CM | POA: Insufficient documentation

## 2012-07-16 DIAGNOSIS — R5381 Other malaise: Secondary | ICD-10-CM | POA: Insufficient documentation

## 2012-07-18 ENCOUNTER — Ambulatory Visit: Payer: BC Managed Care – PPO | Admitting: Physical Therapy

## 2012-07-20 ENCOUNTER — Ambulatory Visit: Payer: BC Managed Care – PPO | Admitting: Physical Therapy

## 2012-07-23 ENCOUNTER — Ambulatory Visit: Payer: BC Managed Care – PPO | Admitting: Physical Therapy

## 2012-07-25 ENCOUNTER — Ambulatory Visit: Payer: BC Managed Care – PPO | Admitting: Physical Therapy

## 2012-07-27 ENCOUNTER — Encounter: Payer: BC Managed Care – PPO | Admitting: Physical Therapy

## 2012-07-30 ENCOUNTER — Ambulatory Visit: Payer: BC Managed Care – PPO | Admitting: *Deleted

## 2012-07-31 ENCOUNTER — Ambulatory Visit: Payer: BC Managed Care – PPO | Admitting: *Deleted

## 2012-08-02 ENCOUNTER — Ambulatory Visit: Payer: BC Managed Care – PPO | Admitting: Physical Therapy

## 2012-08-06 ENCOUNTER — Ambulatory Visit: Payer: BC Managed Care – PPO | Admitting: Physical Therapy

## 2012-08-08 ENCOUNTER — Ambulatory Visit: Payer: BC Managed Care – PPO | Admitting: Physical Therapy

## 2012-08-10 ENCOUNTER — Ambulatory Visit: Payer: BC Managed Care – PPO | Admitting: *Deleted

## 2012-08-13 ENCOUNTER — Ambulatory Visit: Payer: BC Managed Care – PPO | Attending: Orthopaedic Surgery | Admitting: *Deleted

## 2012-08-13 DIAGNOSIS — M25569 Pain in unspecified knee: Secondary | ICD-10-CM | POA: Insufficient documentation

## 2012-08-13 DIAGNOSIS — R5381 Other malaise: Secondary | ICD-10-CM | POA: Insufficient documentation

## 2012-08-13 DIAGNOSIS — IMO0001 Reserved for inherently not codable concepts without codable children: Secondary | ICD-10-CM | POA: Insufficient documentation

## 2012-08-13 DIAGNOSIS — M25669 Stiffness of unspecified knee, not elsewhere classified: Secondary | ICD-10-CM | POA: Insufficient documentation

## 2012-08-16 ENCOUNTER — Ambulatory Visit: Payer: BC Managed Care – PPO | Admitting: Physical Therapy

## 2018-02-19 ENCOUNTER — Other Ambulatory Visit: Payer: Self-pay

## 2018-02-19 ENCOUNTER — Encounter (INDEPENDENT_AMBULATORY_CARE_PROVIDER_SITE_OTHER): Payer: Self-pay | Admitting: *Deleted

## 2018-02-19 ENCOUNTER — Other Ambulatory Visit (INDEPENDENT_AMBULATORY_CARE_PROVIDER_SITE_OTHER): Payer: Self-pay | Admitting: Internal Medicine

## 2018-02-19 ENCOUNTER — Telehealth (INDEPENDENT_AMBULATORY_CARE_PROVIDER_SITE_OTHER): Payer: Self-pay | Admitting: Internal Medicine

## 2018-02-19 ENCOUNTER — Encounter (HOSPITAL_COMMUNITY)
Admission: RE | Admit: 2018-02-19 | Discharge: 2018-02-19 | Disposition: A | Discharge status: Home / Self Care | Payer: Medicare Other | Source: Ambulatory Visit | Attending: Internal Medicine | Admitting: Internal Medicine

## 2018-02-19 ENCOUNTER — Encounter (HOSPITAL_COMMUNITY): Payer: Self-pay

## 2018-02-19 ENCOUNTER — Ambulatory Visit (INDEPENDENT_AMBULATORY_CARE_PROVIDER_SITE_OTHER): Payer: Medicare Other | Admitting: Internal Medicine

## 2018-02-19 ENCOUNTER — Encounter (INDEPENDENT_AMBULATORY_CARE_PROVIDER_SITE_OTHER): Payer: Self-pay | Admitting: Internal Medicine

## 2018-02-19 VITALS — BP 158/86 | HR 68 | Temp 98.0°F | Ht 68.0 in | Wt 199.7 lb

## 2018-02-19 DIAGNOSIS — I10 Essential (primary) hypertension: Secondary | ICD-10-CM | POA: Insufficient documentation

## 2018-02-19 DIAGNOSIS — K6289 Other specified diseases of anus and rectum: Secondary | ICD-10-CM

## 2018-02-19 DIAGNOSIS — I444 Left anterior fascicular block: Secondary | ICD-10-CM | POA: Diagnosis not present

## 2018-02-19 DIAGNOSIS — Z01812 Encounter for preprocedural laboratory examination: Secondary | ICD-10-CM | POA: Diagnosis present

## 2018-02-19 DIAGNOSIS — R197 Diarrhea, unspecified: Secondary | ICD-10-CM | POA: Diagnosis not present

## 2018-02-19 DIAGNOSIS — R9431 Abnormal electrocardiogram [ECG] [EKG]: Secondary | ICD-10-CM | POA: Diagnosis not present

## 2018-02-19 DIAGNOSIS — Z01810 Encounter for preprocedural cardiovascular examination: Secondary | ICD-10-CM | POA: Insufficient documentation

## 2018-02-19 DIAGNOSIS — K629 Disease of anus and rectum, unspecified: Secondary | ICD-10-CM | POA: Diagnosis not present

## 2018-02-19 LAB — BASIC METABOLIC PANEL
ANION GAP: 7 (ref 5–15)
BUN: 10 mg/dL (ref 8–23)
CALCIUM: 9 mg/dL (ref 8.9–10.3)
CHLORIDE: 105 mmol/L (ref 98–111)
CO2: 26 mmol/L (ref 22–32)
Creatinine, Ser: 0.87 mg/dL (ref 0.61–1.24)
GFR calc non Af Amer: >60 mL/min (ref >60)
Glucose, Bld: 189 mg/dL — ABNORMAL HIGH (ref 70–99)
Potassium: 3.5 mmol/L (ref 3.5–5.1)
SODIUM: 138 mmol/L (ref 135–145)

## 2018-02-19 LAB — CBC
HEMATOCRIT: 47.5 % (ref 39.0–52.0)
Hemoglobin: 16.2 g/dL (ref 13.0–17.0)
MCH: 33.2 pg (ref 26.0–34.0)
MCHC: 34.1 g/dL (ref 30.0–36.0)
MCV: 97.3 fL (ref 78.0–100.0)
Platelets: 236 10*3/uL (ref 150–400)
RBC: 4.88 MIL/uL (ref 4.22–5.81)
RDW: 13 % (ref 11.5–15.5)
WBC: 11.3 10*3/uL — ABNORMAL HIGH (ref 4.0–10.5)

## 2018-02-19 NOTE — Progress Notes (Signed)
Subjective:    Patient ID: Tim Williams, male    DOB: 1950/08/23, 67 y.o.   MRN: [id number]  HPI Referred by Dr. Edrick Oh for chronic diarrhea. He tells me since starting the Cipro and Flagyl, the diarrhea has slowed from 12-15 stools a day, now is about 7 stools a day.  He says his stools are better since starting the antibiotics.His symptoms started out of the blue. The diarrhea started over a year ago. Has had some incontinence. He says he had stool studies. He is not having any abdominal pain.  He has had a few dizzy spells. He denies any weight loss.He has not seen any blood in his stools. Has been taking Lomotil for his diarrhea.  Has not been on any antibiotics since 2014. He has never undergone a colonoscopy in the past. No family hx of colon cancer.   02/15/2018 C diff negative, stool studies negative.  Diabetic for a couple of weeks. Started on Glipizide.  Retired from [employer]. Two children,. Widowed.  Review of Systems Past Medical History:  Diagnosis Date  . Arthritis   . COPD (chronic obstructive pulmonary disease) ([place])   . Diabetes ([place])   . Gastric ulcer    2009  . Hypertension   . Shortness of breath     Past Surgical History:  Procedure Laterality Date  . BACK SURGERY     lumbar discectomy  . CYSTOSCOPY     stone removal  . EYE SURGERY     muscle repair right eye-age 3  . HERNIA REPAIR     left age 82  . TOTAL KNEE ARTHROPLASTY  05/17/2012   Procedure: TOTAL KNEE ARTHROPLASTY;  Surgeon: Sanjuana Kava, MD;  Location: [facility];  Service: Orthopedics;  Laterality: Right;  . TRANSURETHRAL RESECTION OF PROSTATE      Allergies  Allergen Reactions  . Ibuprofen     Pt can't take due to ulcers.  . Penicillins Other (See Comments)    "knocks out"    Current Outpatient Medications on File Prior to Visit  Medication Sig Dispense Refill  . amLODipine (NORVASC) 5 MG tablet Take 5 mg by mouth daily.    . ciprofloxacin (CIPRO) 500 MG tablet Take 500 mg by mouth 2  (two) times daily.    . diphenoxylate-atropine (LOMOTIL) 2.5-0.025 MG tablet Take by mouth 4 (four) times daily as needed for diarrhea or loose stools.    Marland Kitchen glipiZIDE (GLUCOTROL) 5 MG tablet Take by mouth daily before breakfast.    . metroNIDAZOLE (FLAGYL) 500 MG tablet Take 500 mg by mouth 2 (two) times daily.    . potassium chloride (K-DUR) 10 MEQ tablet Take 10 mEq by mouth 2 (two) times daily.     No current facility-administered medications on file prior to visit.         Objective:   Physical Exam Blood pressure (!) 158/86, pulse 68, temperature 98 F (36.7 C), height 5\' 8"  (1.727 m), weight 199 lb 11.2 oz (90.6 kg). Alert and oriented. Skin warm and dry. Oral mucosa is moist.   . Sclera anicteric, conjunctivae is pink. Thyroid not enlarged. No cervical lymphadenopathy. Lungs clear. Heart regular rate and rhythm.  Abdomen is soft. Bowel sounds are positive. No hepatomegaly. No abdominal masses felt. No tenderness.  No edema to lower extremities.  Rectal exam: rectal mass felt. Guaiac positive.        Assessment & Plan:  Chronic diarrhea. Rectal mass. Has never undergone a colonoscopy. Will go and set up  for one. Colonic neoplasm needs to be ruled out.  Imodium twice a day.

## 2018-02-19 NOTE — Progress Notes (Signed)
Pt was seen today 02/19/2018 at [facility] for PAT testing prior to his  colonoscopy with propofol scheduled for Friday 02/23/2018. Patient states that he had stroke like symptoms a while back but was never seen by a doctor. He seems to be unclear of the actual date of occurrence but his son states that the symptoms occurred a few weeks ago. Patient smokes 1 1/2 to 2 ppd and is sob on exertion.  Anesthesiologist recommends a follow up appointment with his primary care before procedure. Appointment scheduled with Dr. Edrick Oh on 02/20/2018 @ 1300 for procedure clearance .  Patient was notified at home and agrees to keep his scheduled appointment.

## 2018-02-19 NOTE — Patient Instructions (Addendum)
Imodium twice a day. (Anit-diarrheal)  One in morning and one in evening.  The risks of bleeding, perforation and infection were reviewed with patient.

## 2018-02-19 NOTE — Patient Instructions (Addendum)
   Your procedure is scheduled on: 02/23/2018  Report to [facility] at   12:30  PM.  Call this number if you have problems the morning of surgery: [phone]   Remember:              Follow Directions on the letter you received from Your Physician's office regarding the Bowel Prep  :  Take these medicines the morning of surgery with A SIP OF WATER: amolodipine   Do not wear jewelry, make-up or nail polish.    Do not bring valuables to the hospital.  Contacts, dentures or bridgework may not be worn into surgery.  .   Patients discharged the day of surgery will not be allowed to drive home.     Colonoscopy, Adult, Care After This sheet gives you information about how to care for yourself after your procedure. Your health care provider may also give you more specific instructions. If you have problems or questions, contact your health care provider. What can I expect after the procedure? After the procedure, it is common to have:  A small amount of blood in your stool for 24 hours after the procedure.  Some gas.  Mild abdominal cramping or bloating.  Follow these instructions at home: General instructions   For the first 24 hours after the procedure: ? Do not drive or use machinery. ? Do not sign important documents. ? Do not drink alcohol. ? Do your regular daily activities at a slower pace than normal. ? Eat soft, easy-to-digest foods. ? Rest often.  Take over-the-counter or prescription medicines only as told by your health care provider.  It is up to you to get the results of your procedure. Ask your health care provider, or the department performing the procedure, when your results will be ready. Relieving cramping and bloating  Try walking around when you have cramps or feel bloated.  Apply heat to your abdomen as told by your health care provider. Use a heat source that your health care provider recommends, such as a moist heat pack or a heating pad. ? Place a  towel between your skin and the heat source. ? Leave the heat on for 20-30 minutes. ? Remove the heat if your skin turns bright red. This is especially important if you are unable to feel pain, heat, or cold. You may have a greater risk of getting burned. Eating and drinking  Drink enough fluid to keep your urine clear or pale yellow.  Resume your normal diet as instructed by your health care provider. Avoid heavy or fried foods that are hard to digest.  Avoid drinking alcohol for as long as instructed by your health care provider. Contact a health care provider if:  You have blood in your stool 2-3 days after the procedure. Get help right away if:  You have more than a small spotting of blood in your stool.  You pass large blood clots in your stool.  Your abdomen is swollen.  You have nausea or vomiting.  You have a fever.  You have increasing abdominal pain that is not relieved with medicine. This information is not intended to replace advice given to you by your health care provider. Make sure you discuss any questions you have with your health care provider. Document Released: 01/12/2004 Document Revised: 02/22/2016 Document Reviewed: 08/11/2015 Elsevier Interactive Patient Education  [employer].

## 2018-02-19 NOTE — Telephone Encounter (Signed)
err

## 2018-02-23 ENCOUNTER — Encounter (HOSPITAL_COMMUNITY)
Admission: RE | Disposition: A | Discharge status: Home / Self Care | Payer: Self-pay | Source: Ambulatory Visit | Attending: Internal Medicine

## 2018-02-23 ENCOUNTER — Ambulatory Visit (HOSPITAL_COMMUNITY): Payer: Medicare Other | Admitting: Anesthesiology

## 2018-02-23 ENCOUNTER — Ambulatory Visit (HOSPITAL_COMMUNITY)
Admission: RE | Admit: 2018-02-23 | Discharge: 2018-02-23 | Disposition: A | Discharge status: Home / Self Care | Payer: Medicare Other | Source: Ambulatory Visit | Attending: Internal Medicine | Admitting: Internal Medicine

## 2018-02-23 ENCOUNTER — Encounter (HOSPITAL_COMMUNITY): Payer: Self-pay

## 2018-02-23 DIAGNOSIS — J449 Chronic obstructive pulmonary disease, unspecified: Secondary | ICD-10-CM | POA: Insufficient documentation

## 2018-02-23 DIAGNOSIS — K629 Disease of anus and rectum, unspecified: Secondary | ICD-10-CM

## 2018-02-23 DIAGNOSIS — Z8719 Personal history of other diseases of the digestive system: Secondary | ICD-10-CM | POA: Diagnosis not present

## 2018-02-23 DIAGNOSIS — D122 Benign neoplasm of ascending colon: Secondary | ICD-10-CM | POA: Insufficient documentation

## 2018-02-23 DIAGNOSIS — Z880 Allergy status to penicillin: Secondary | ICD-10-CM | POA: Diagnosis not present

## 2018-02-23 DIAGNOSIS — I10 Essential (primary) hypertension: Secondary | ICD-10-CM | POA: Diagnosis not present

## 2018-02-23 DIAGNOSIS — Z886 Allergy status to analgesic agent status: Secondary | ICD-10-CM | POA: Insufficient documentation

## 2018-02-23 DIAGNOSIS — K6289 Other specified diseases of anus and rectum: Secondary | ICD-10-CM

## 2018-02-23 DIAGNOSIS — K644 Residual hemorrhoidal skin tags: Secondary | ICD-10-CM | POA: Insufficient documentation

## 2018-02-23 DIAGNOSIS — K621 Rectal polyp: Secondary | ICD-10-CM

## 2018-02-23 DIAGNOSIS — Z7984 Long term (current) use of oral hypoglycemic drugs: Secondary | ICD-10-CM | POA: Insufficient documentation

## 2018-02-23 DIAGNOSIS — D127 Benign neoplasm of rectosigmoid junction: Secondary | ICD-10-CM

## 2018-02-23 DIAGNOSIS — Z79899 Other long term (current) drug therapy: Secondary | ICD-10-CM | POA: Diagnosis not present

## 2018-02-23 DIAGNOSIS — E119 Type 2 diabetes mellitus without complications: Secondary | ICD-10-CM | POA: Diagnosis not present

## 2018-02-23 DIAGNOSIS — Z8711 Personal history of peptic ulcer disease: Secondary | ICD-10-CM | POA: Diagnosis not present

## 2018-02-23 DIAGNOSIS — K529 Noninfective gastroenteritis and colitis, unspecified: Secondary | ICD-10-CM | POA: Diagnosis present

## 2018-02-23 DIAGNOSIS — M1990 Unspecified osteoarthritis, unspecified site: Secondary | ICD-10-CM | POA: Diagnosis not present

## 2018-02-23 DIAGNOSIS — F17210 Nicotine dependence, cigarettes, uncomplicated: Secondary | ICD-10-CM | POA: Diagnosis not present

## 2018-02-23 DIAGNOSIS — D128 Benign neoplasm of rectum: Secondary | ICD-10-CM | POA: Diagnosis not present

## 2018-02-23 HISTORY — PX: COLONOSCOPY WITH PROPOFOL: SHX5780

## 2018-02-23 HISTORY — PX: POLYPECTOMY: SHX5525

## 2018-02-23 HISTORY — PX: BIOPSY: SHX5522

## 2018-02-23 LAB — GLUCOSE, CAPILLARY
Glucose-Capillary: 148 mg/dL — ABNORMAL HIGH (ref 70–99)
Glucose-Capillary: 133 mg/dL — ABNORMAL HIGH (ref 70–99)

## 2018-02-23 SURGERY — COLONOSCOPY WITH PROPOFOL
Anesthesia: General

## 2018-02-23 MED ORDER — HYDROMORPHONE HCL 1 MG/ML IJ SOLN: 0.2500 mg | INTRAMUSCULAR | Status: DC | PRN

## 2018-02-23 MED ORDER — CHLORHEXIDINE GLUCONATE CLOTH 2 % EX PADS: 6.0000 | MEDICATED_PAD | Freq: Once | CUTANEOUS | Status: DC

## 2018-02-23 MED ORDER — MIDAZOLAM HCL 2 MG/2ML IJ SOLN: 0.5000 mg | Freq: Once | INTRAMUSCULAR | Status: DC | PRN

## 2018-02-23 MED ORDER — PROPOFOL 500 MG/50ML IV EMUL
INTRAVENOUS | Status: DC | PRN
  Administered 2018-02-23: 14:00:00 via INTRAVENOUS
  Administered 2018-02-23: 150 ug/kg/min via INTRAVENOUS
  Administered 2018-02-23: 15:00:00 via INTRAVENOUS

## 2018-02-23 MED ORDER — HYDROCODONE-ACETAMINOPHEN 7.5-325 MG PO TABS: 1.0000 | ORAL_TABLET | Freq: Once | ORAL | Status: DC | PRN

## 2018-02-23 MED ORDER — PROPOFOL 10 MG/ML IV BOLUS
INTRAVENOUS | Status: DC | PRN
  Administered 2018-02-23: 30 mg via INTRAVENOUS

## 2018-02-23 MED ORDER — PROMETHAZINE HCL 25 MG/ML IJ SOLN: 6.2500 mg | INTRAMUSCULAR | Status: DC | PRN

## 2018-02-23 MED ORDER — LACTATED RINGERS IV SOLN
INTRAVENOUS | Status: DC
  Administered 2018-02-23 (×2): via INTRAVENOUS

## 2018-02-23 NOTE — Anesthesia Preprocedure Evaluation (Signed)
Anesthesia Evaluation  Patient identified by MRN, date of birth, ID band Patient awake    Reviewed: Allergy & Precautions, NPO status , Patient's Chart, lab work & pertinent test results  Airway Mallampati: II  TM Distance: >3 FB Neck ROM: Full    Dental no notable dental hx. (+) Upper Dentures   Pulmonary neg pulmonary ROS, shortness of breath, COPD, Current Smoker,  Smokes 1.5 PPD -denies any inhaler use  Denies any steroid inhaler use >80 pack year smoking history   Pulmonary exam normal breath sounds clear to auscultation + decreased breath sounds+ wheezing      Cardiovascular Exercise Tolerance: Good hypertension, Pt. on medications negative cardio ROS Normal cardiovascular examI Rhythm:Regular Rate:Normal     Neuro/Psych negative neurological ROS  negative psych ROS   GI/Hepatic negative GI ROS, Neg liver ROS, PUD,   Endo/Other  negative endocrine ROSdiabetes, Type 2  Renal/GU negative Renal ROS  negative genitourinary   Musculoskeletal negative musculoskeletal ROS (+) Arthritis , Osteoarthritis,    Abdominal   Peds negative pediatric ROS (+)  Hematology negative hematology ROS (+)   Anesthesia Other Findings   Reproductive/Obstetrics negative OB ROS                             Anesthesia Physical Anesthesia Plan  ASA: III  Anesthesia Plan: General   Post-op Pain Management:    Induction: Intravenous  PONV Risk Score and Plan:   Airway Management Planned: Nasal Cannula and Simple Face Mask  Additional Equipment:   Intra-op Plan:   Post-operative Plan:   Informed Consent: I have reviewed the patients History and Physical, chart, labs and discussed the procedure including the risks, benefits and alternatives for the proposed anesthesia with the patient or authorized representative who has indicated his/her understanding and acceptance.   Dental advisory  given  Plan Discussed with: CRNA  Anesthesia Plan Comments:         Anesthesia Quick Evaluation

## 2018-02-23 NOTE — Discharge Instructions (Signed)
No aspirin or NSAIDs for 1 week. Resume usual medications as before. Take diphenoxylate on schedule; 1 tablet before each meal. Resume usual diet. No driving for 24 hours. Physician will call with biopsy results.      Colonoscopy, Adult, Care After This sheet gives you information about how to care for yourself after your procedure. Your doctor may also give you more specific instructions. If you have problems or questions, call your doctor. Follow these instructions at home: General instructions   For the first 24 hours after the procedure: ? Do not drive or use machinery. ? Do not sign important documents. ? Do not drink alcohol. ? Do your daily activities more slowly than normal. ? Eat foods that are soft and easy to digest. ? Rest often.  Take over-the-counter or prescription medicines only as told by your doctor.  It is up to you to get the results of your procedure. Ask your doctor, or the department performing the procedure, when your results will be ready. To help cramping and bloating:  Try walking around.  Put heat on your belly (abdomen) as told by your doctor. Use a heat source that your doctor recommends, such as a moist heat pack or a heating pad. ? Put a towel between your skin and the heat source. ? Leave the heat on for 20-30 minutes. ? Remove the heat if your skin turns bright red. This is especially important if you cannot feel pain, heat, or cold. You can get burned. Eating and drinking  Drink enough fluid to keep your pee (urine) clear or pale yellow.  Return to your normal diet as told by your doctor. Avoid heavy or fried foods that are hard to digest.  Avoid drinking alcohol for as long as told by your doctor. Contact a doctor if:  You have blood in your poop (stool) 2-3 days after the procedure. Get help right away if:  You have more than a small amount of blood in your poop.  You see large clumps of tissue (blood clots) in your poop.  Your  belly is swollen.  You feel sick to your stomach (nauseous).  You throw up (vomit).  You have a fever.  You have belly pain that gets worse, and medicine does not help your pain. This information is not intended to replace advice given to you by your health care provider. Make sure you discuss any questions you have with your health care provider. Document Released: 07/02/2010 Document Revised: 02/22/2016 Document Reviewed: 02/22/2016 Elsevier Interactive Patient Education  2017 [place].    Colon Polyps Polyps are tissue growths inside the body. Polyps can grow in many places, including the large intestine (colon). A polyp may be a round bump or a mushroom-shaped growth. You could have one polyp or several. Most colon polyps are noncancerous (benign). However, some colon polyps can become cancerous over time. What are the causes? The exact cause of colon polyps is not known. What increases the risk? This condition is more likely to develop in people who:  Have a family history of colon cancer or colon polyps.  Are older than 17 or older than 45 if they are African American.  Have inflammatory bowel disease, such as ulcerative colitis or Crohn disease.  Are overweight.  Smoke cigarettes.  Do not get enough exercise.  Drink too much alcohol.  Eat a diet that is: ? High in fat and red meat. ? Low in fiber.  Had childhood cancer that was treated with  abdominal radiation.  What are the signs or symptoms? Most polyps do not cause symptoms. If you have symptoms, they may include:  Blood coming from your rectum when having a bowel movement.  Blood in your stool.The stool may look dark red or black.  A change in bowel habits, such as constipation or diarrhea.  How is this diagnosed? This condition is diagnosed with a colonoscopy. This is a procedure that uses a lighted, flexible scope to look at the inside of your colon. How is this treated? Treatment for this  condition involves removing any polyps that are found. Those polyps will then be tested for cancer. If cancer is found, your health care provider will talk to you about options for colon cancer treatment. Follow these instructions at home: Diet  Eat plenty of fiber, such as fruits, vegetables, and whole grains.  Eat foods that are high in calcium and vitamin D, such as milk, cheese, yogurt, eggs, liver, fish, and broccoli.  Limit foods high in fat, red meats, and processed meats, such as hot dogs, sausage, bacon, and lunch meats.  Maintain a healthy weight, or lose weight if recommended by your health care provider. General instructions  Do not smoke cigarettes.  Do not drink alcohol excessively.  Keep all follow-up visits as told by your health care provider. This is important. This includes keeping regularly scheduled colonoscopies. Talk to your health care provider about when you need a colonoscopy.  Exercise every day or as told by your health care provider. Contact a health care provider if:  You have new or worsening bleeding during a bowel movement.  You have new or increased blood in your stool.  You have a change in bowel habits.  You unexpectedly lose weight. This information is not intended to replace advice given to you by your health care provider. Make sure you discuss any questions you have with your health care provider. Document Released: 02/24/2004 Document Revised: 11/05/2015 Document Reviewed: 04/20/2015 Elsevier Interactive Patient Education  2018 [place], Care After These instructions provide you with information about caring for yourself after your procedure. Your health care provider may also give you more specific instructions. Your treatment has been planned according to current medical practices, but problems sometimes occur. Call your health care provider if you have any problems or questions after your  procedure. What can I expect after the procedure? After your procedure, it is common to:  Feel sleepy for several hours.  Feel clumsy and have poor balance for several hours.  Feel forgetful about what happened after the procedure.  Have poor judgment for several hours.  Feel nauseous or vomit.  Have a sore throat if you had a breathing tube during the procedure.  Follow these instructions at home: For at least 24 hours after the procedure:   Do not: ? Participate in activities in which you could fall or become injured. ? Drive. ? Use heavy machinery. ? Drink alcohol. ? Take sleeping pills or medicines that cause drowsiness. ? Make important decisions or sign legal documents. ? Take care of children on your own.  Rest. Eating and drinking  Follow the diet that is recommended by your health care provider.  If you vomit, drink water, juice, or soup when you can drink without vomiting.  Make sure you have little or no nausea before eating solid foods. General instructions  Have a responsible adult stay with you until you are awake and alert.  Take over-the-counter and prescription medicines only as told by your health care provider.  If you smoke, do not smoke without supervision.  Keep all follow-up visits as told by your health care provider. This is important. Contact a health care provider if:  You keep feeling nauseous or you keep vomiting.  You feel light-headed.  You develop a rash.  You have a fever. Get help right away if:  You have trouble breathing. This information is not intended to replace advice given to you by your health care provider. Make sure you discuss any questions you have with your health care provider. Document Released: 09/20/2015 Document Revised: 01/20/2016 Document Reviewed: 09/20/2015 Elsevier Interactive Patient Education  [employer].

## 2018-02-23 NOTE — H&P (Addendum)
Tim Williams is an 67 y.o. male.   Chief Complaint: Patient is here for colonoscopy. HPI: Patient 66 year old Caucasian male who has had diarrhea for 2 years and it has gotten worse lately.  He has has had as many as 20 stools on some days.  Stool is liquid and its of support usually small in amount.  He denies rectal bleeding melena or abdominal pain.  He has not lost any appetite.  He has never been screened for CRC.  He was diagnosed with diabetes mellitus 3 weeks ago. He was seen in the office last week and noted to have rectal mass.  His stool was heme positive.  He is therefore here for diagnostic colonoscopy. His H&H was 16.2 and 47.5 on 02/19/2018. Family history is negative for CRC.  Past Medical History:  Diagnosis Date  . Arthritis   . COPD (chronic obstructive pulmonary disease) ([place])   . Diabetes ([place])   . Gastric ulcer    2009  . Hypertension   . Shortness of breath     Past Surgical History:  Procedure Laterality Date  . BACK SURGERY     lumbar discectomy  . CYSTOSCOPY     stone removal  . EYE SURGERY     muscle repair right eye-age 30  . HERNIA REPAIR     left age 17  . TOTAL KNEE ARTHROPLASTY  05/17/2012   Procedure: TOTAL KNEE ARTHROPLASTY;  Surgeon: Sanjuana Kava, MD;  Location: [facility];  Service: Orthopedics;  Laterality: Right;  . TRANSURETHRAL RESECTION OF PROSTATE      History reviewed. No pertinent family history. Social History:  reports that he has been smoking cigarettes. He has a 80.00 pack-year smoking history. He has never used smokeless tobacco. He reports that he does not drink alcohol or use drugs.  Allergies:  Allergies  Allergen Reactions  . Ibuprofen     Pt can't take due to ulcers.  . Penicillins Other (See Comments)    "knocks out"    Medications Prior to Admission  Medication Sig Dispense Refill  . amLODipine (NORVASC) 5 MG tablet Take 5 mg by mouth daily.    . ciprofloxacin (CIPRO) 500 MG tablet Take 500 mg by mouth 2 (two) times  daily.    . diphenoxylate-atropine (LOMOTIL) 2.5-0.025 MG tablet Take by mouth 4 (four) times daily as needed for diarrhea or loose stools.    Marland Kitchen glipiZIDE (GLUCOTROL) 5 MG tablet Take by mouth daily before breakfast.    . metroNIDAZOLE (FLAGYL) 500 MG tablet Take 500 mg by mouth 2 (two) times daily.    . potassium chloride (K-DUR) 10 MEQ tablet Take 10 mEq by mouth 2 (two) times daily.      Results for orders placed or performed during the hospital encounter of 02/23/18 (from the past 48 hour(s))  Glucose, capillary     Status: Abnormal   Collection Time: 02/23/18 12:28 PM  Result Value Ref Range   Glucose-Capillary 148 (H) 70 - 99 mg/dL   No results found.  ROS  Blood pressure (!) 150/73, pulse 76, temperature 98.4 F (36.9 C), temperature source Oral, SpO2 94 %. Physical Exam  Constitutional: He appears well-developed and well-nourished.  HENT:  Mouth/Throat: Oropharynx is clear and moist.  Eyes: Conjunctivae are normal. No scleral icterus.  Neck: No thyromegaly present.  Cardiovascular: Normal rate, regular rhythm and normal heart sounds.  No murmur heard. Respiratory: Effort normal and breath sounds normal.  GI:  Abdomen is full with umbilical hernia which  is partially reducible.  It is soft and nontender.  Abdomen is soft.  Mild tenderness noted at RLQ.  No organomegaly or masses.  Musculoskeletal: He exhibits edema.  Mild edema noted to both legs right greater than left.  No calf tenderness noted. Right knee scar from previous surgery.  Neurological: He is alert.  Skin: Skin is warm and dry.     Assessment/Plan Rectal mass. Diagnostic colonoscopy.  Hildred Laser, MD 02/23/2018, 1:57 PM

## 2018-02-23 NOTE — Progress Notes (Signed)
Per patient and son, "Ok to leave messages on home phone when Dr. Laural Golden calls with results."

## 2018-02-23 NOTE — Op Note (Signed)
[place] Patient Name: Tim Williams Procedure Date: 02/23/2018 1:40 PM MRN: [id number] Date of Birth: 06-16-1950 Attending MD: Hildred Laser , MD CSN: [id number] Age: 67 Admit Type: Outpatient Procedure:                Colonoscopy Indications:              Chronic diarrhea, Abnormal rectal exam Providers:                Hildred Laser, MD, Otis Peak B. Sharon Seller, RN, Nelma Rothman, Technician Referring MD:             Dione Housekeeper, MD Medicines:                Propofol per Anesthesia Complications:            No immediate complications. Estimated Blood Loss:      Procedure:                Pre-Anesthesia Assessment:                           - Prior to the procedure, a History and Physical                            was performed, and patient medications and                            allergies were reviewed. The patient's tolerance of                            previous anesthesia was also reviewed. The risks                            and benefits of the procedure and the sedation                            options and risks were discussed with the patient.                            All questions were answered, and informed consent                            was obtained. Prior Anticoagulants: The patient has                            taken no previous anticoagulant or antiplatelet                            agents. ASA Grade Assessment: III - A patient with                            severe systemic disease. After reviewing the risks  and benefits, the patient was deemed in                            satisfactory condition to undergo the procedure.                           After obtaining informed consent, the colonoscope                            was passed under direct vision. Throughout the                            procedure, the patient's blood pressure, pulse, and                            oxygen saturations were  monitored continuously. The                            PCF-H190DL ([id number]) scope was introduced through                            the and advanced to the the cecum, identified by                            appendiceal orifice and ileocecal valve. The                            colonoscopy was somewhat difficult due to a                            redundant colon and a tortuous colon. The patient                            tolerated the procedure well. The quality of the                            bowel preparation was adequate. The ileocecal                            valve, appendiceal orifice, and rectum were                            photographed. Scope In: 2:11:34 PM Scope Out: 2:51:34 PM Scope Withdrawal Time: 0 hours 28 minutes 34 seconds  Total Procedure Duration: 0 hours 40 minutes 0 seconds  Findings:      The perianal examination was normal.      The digital rectal exam revealed a [size]large soft and firm rectal mass       palpated 10 cm from the anal verge.      A small polyp was found in the ascending colon. The polyp was sessile.       This was biopsied with a cold forceps for histology. The pathology       specimen was placed into Bottle Number 1.      A 8 mm polyp  was found in the ascending colon. The polyp was sessile.       The polyp was removed with a hot snare. Resection and retrieval were       complete. The pathology specimen was placed into Bottle Number 1.      A greater than 50 mm polyp was found in the rectum recto-sigmoid colon.       The polyp was multi-lobulated. Biopsies were taken with a cold forceps       for histology. The pathology specimen was placed into Bottle Number 2.      External hemorrhoids were found during retroflexion. The hemorrhoids       were small. Impression:               - Rectal mass 10 cm from the anal verge.                           - One small polyp in the ascending colon. Biopsied.                           - One 8 mm polyp  in the ascending colon, removed                            with a hot snare. Resected and retrieved.                           - One greater than 80 mm polyp in the rectum                            extending to recto-sigmoid colon. Biopsied.                           - External hemorrhoids. Moderate Sedation:      Per Anesthesia Care Recommendation:           - Patient has a contact number available for                            emergencies. The signs and symptoms of potential                            delayed complications were discussed with the                            patient. Return to normal activities tomorrow.                            Written discharge instructions were provided to the                            patient.                           - Resume previous diet today.                           - Continue present medications.                           -  No aspirin, ibuprofen, naproxen, or other                            non-steroidal anti-inflammatory drugs for 7 days                            after polyp removal.                           - Await pathology results.                           - Repeat colonoscopy is recommended. The                            colonoscopy date will be determined after pathology                            results from today's exam become available for                            review. Procedure Code(s):        --- Professional ---                           [phone], Colonoscopy, flexible; with removal of                            tumor(s), polyp(s), or other lesion(s) by snare                            technique                           45380, 67, Colonoscopy, flexible; with biopsy,                            single or multiple Diagnosis Code(s):        --- Professional ---                           K62.89, Other specified diseases of anus and rectum                           D12.2, Benign neoplasm of ascending colon                            K62.1, Rectal polyp                           D12.7, Benign neoplasm of rectosigmoid junction                           K64.4, Residual hemorrhoidal skin tags                           K52.9, Noninfective gastroenteritis and colitis,  unspecified CPT copyright 2017 American Medical Association. All rights reserved. The codes documented in this report are preliminary and upon coder review may  be revised to meet current compliance requirements. Hildred Laser, MD Hildred Laser, MD 02/23/2018 3:03:05 PM This report has been signed electronically. Number of Addenda: 0

## 2018-02-23 NOTE — Anesthesia Postprocedure Evaluation (Signed)
Anesthesia Post Note  Patient: Tim Williams  Procedure(s) Performed: COLONOSCOPY WITH PROPOFOL (N/A ) POLYPECTOMY BIOPSY  Patient location during evaluation: PACU Anesthesia Type: General Level of consciousness: awake and alert and oriented Pain management: pain level controlled Vital Signs Assessment: post-procedure vital signs reviewed and stable Respiratory status: spontaneous breathing Cardiovascular status: stable Postop Assessment: no apparent nausea or vomiting Anesthetic complications: no     Last Vitals:  Vitals:   02/23/18 1515 02/23/18 1516  BP: 122/77 122/77  Pulse: (!) 58 63  Resp: 15 17  Temp:    SpO2: 98%     Last Pain:  Vitals:   02/23/18 1516  TempSrc:   PainSc: 0-No pain                 Jessikah Dicker A

## 2018-02-23 NOTE — Anesthesia Procedure Notes (Signed)
Procedure Name: General with mask airway Performed by: Deaun Rocha A, CRNA Pre-anesthesia Checklist: Patient identified, Emergency Drugs available, Suction available, Patient being monitored and Timeout performed Oxygen Delivery Method: Simple face mask       

## 2018-02-23 NOTE — Transfer of Care (Signed)
Immediate Anesthesia Transfer of Care Note  Patient: Tim Williams  Procedure(s) Performed: COLONOSCOPY WITH PROPOFOL (N/A ) POLYPECTOMY BIOPSY  Patient Location: PACU  Anesthesia Type:MAC  Level of Consciousness: awake, sedated and patient cooperative  Airway & Oxygen Therapy: Patient Spontanous Breathing  Post-op Assessment: Report given to RN and Post -op Vital signs reviewed and stable  Post vital signs: Reviewed and stable  Last Vitals:  Vitals Value Taken Time  BP    Temp    Pulse 64 02/23/2018  3:01 PM  Resp 19 02/23/2018  3:01 PM  SpO2 99 % 02/23/2018  3:01 PM  Vitals shown include unvalidated device data.  Last Pain:  Vitals:   02/23/18 1407  TempSrc:   PainSc: 0-No pain      Patients Stated Pain Goal: 8 (50/27/74 1287)  Complications: No apparent anesthesia complications

## 2018-02-27 ENCOUNTER — Telehealth (INDEPENDENT_AMBULATORY_CARE_PROVIDER_SITE_OTHER): Payer: Self-pay | Admitting: Internal Medicine

## 2018-02-27 NOTE — Telephone Encounter (Signed)
Patients son left voice mail message stating he would like to talk to you- please call [phone]

## 2018-02-28 ENCOUNTER — Telehealth (INDEPENDENT_AMBULATORY_CARE_PROVIDER_SITE_OTHER): Payer: Self-pay | Admitting: Internal Medicine

## 2018-02-28 ENCOUNTER — Encounter (HOSPITAL_COMMUNITY): Payer: Self-pay | Admitting: Internal Medicine

## 2018-02-28 NOTE — Telephone Encounter (Signed)
Patient's son called asking about what the next step would be as far as scheduling surgery - also asked if a certain medication could be called into the pharmacy instead of him having to pick it up - please call patient at [phone]

## 2018-02-28 NOTE — Telephone Encounter (Signed)
This will need to be addressed by Dr. Laural Golden.  I do not see any notes in Epic concerning this.

## 2018-03-03 NOTE — Telephone Encounter (Signed)
Patient or his son never responded to calls.

## 2018-03-19 ENCOUNTER — Telehealth (INDEPENDENT_AMBULATORY_CARE_PROVIDER_SITE_OTHER): Payer: Self-pay | Admitting: Internal Medicine

## 2018-03-19 NOTE — Telephone Encounter (Signed)
Patient left message for someone to call him at ph# [phone]

## 2018-03-20 NOTE — Telephone Encounter (Signed)
Please send this to Tammy.  I think this patient is suppose to see a surgeon, but I don't see where he has seen one. Dr. Laural Golden is following this patient.

## 2018-03-22 ENCOUNTER — Ambulatory Visit (INDEPENDENT_AMBULATORY_CARE_PROVIDER_SITE_OTHER): Payer: Medicare Other | Admitting: General Surgery

## 2018-03-22 ENCOUNTER — Encounter: Payer: Self-pay | Admitting: General Surgery

## 2018-03-22 VITALS — BP 153/86 | HR 84 | Temp 98.4°F | Resp 18 | Wt 194.0 lb

## 2018-03-22 DIAGNOSIS — K6289 Other specified diseases of anus and rectum: Secondary | ICD-10-CM | POA: Diagnosis not present

## 2018-03-22 MED ORDER — METRONIDAZOLE 500 MG PO TABS: 500.0000 mg | ORAL_TABLET | Freq: Three times a day (TID) | ORAL | 0 refills | Status: DC

## 2018-03-22 MED ORDER — PEG 3350-KCL-NABCB-NACL-NASULF 236 G PO SOLR: 4000.0000 mL | Freq: Once | ORAL | 0 refills | Status: AC

## 2018-03-22 MED ORDER — NEOMYCIN SULFATE 500 MG PO TABS: 1000.0000 mg | ORAL_TABLET | Freq: Three times a day (TID) | ORAL | 0 refills | Status: DC

## 2018-03-22 NOTE — H&P (Signed)
Tim Williams; [id number]; 12-Jul-1950   HPI Patient is a 67 year old white male who was referred to my care by Dr. Edrick Oh and [facility] for evaluation treatment of a rectal mass.  Patient had been passing some blood per rectum.  He underwent a colonoscopy by Dr. Laural Golden and was found to have a large hard rectal mass in the upper portion of his rectum.  Biopsies were done which revealed only tubular adenoma.  He had other tubular adenomas in the ascending colon which were removed.  Patient states he has had mucousy diarrhea and some blood in his stools for many weeks.  He denies any family history of colon cancer.  He currently has no abdominal pain.  He does suffer from COPD which seems to be somewhat stable at this time. Past Medical History:  Diagnosis Date  . Arthritis   . COPD (chronic obstructive pulmonary disease) ([place])   . Diabetes ([place])   . Gastric ulcer    2009  . Hypertension   . Shortness of breath     Past Surgical History:  Procedure Laterality Date  . BACK SURGERY     lumbar discectomy  . BIOPSY  02/23/2018   Procedure: BIOPSY;  Surgeon: Rogene Houston, MD;  [facility];  Service: Endoscopy;;  colon  . COLONOSCOPY WITH PROPOFOL N/A 02/23/2018   Procedure: COLONOSCOPY WITH PROPOFOL;  Surgeon: Rogene Houston, MD;  Location: [facility];  Service: Endoscopy;  Laterality: N/A;  2:10  . CYSTOSCOPY     stone removal  . EYE SURGERY     muscle repair right eye-age 47  . HERNIA REPAIR     left age 23  . POLYPECTOMY  02/23/2018   Procedure: POLYPECTOMY;  Surgeon: Rogene Houston, MD;  [facility];  Service: Endoscopy;;  colon  . TOTAL KNEE ARTHROPLASTY  05/17/2012   Procedure: TOTAL KNEE ARTHROPLASTY;  Surgeon: Sanjuana Kava, MD;  Location: [facility];  Service: Orthopedics;  Laterality: Right;  . TRANSURETHRAL RESECTION OF PROSTATE      History reviewed. No pertinent family history.  Current Outpatient Medications on File Prior to Visit  Medication Sig  Dispense Refill  . amLODipine (NORVASC) 5 MG tablet Take 5 mg by mouth daily.    . diphenoxylate-atropine (LOMOTIL) 2.5-0.025 MG tablet Take by mouth 4 (four) times daily as needed for diarrhea or loose stools.    Marland Kitchen glipiZIDE (GLUCOTROL) 5 MG tablet Take by mouth daily before breakfast.    . potassium chloride (K-DUR) 10 MEQ tablet Take 10 mEq by mouth 2 (two) times daily.     No current facility-administered medications on file prior to visit.     Allergies  Allergen Reactions  . Ibuprofen     Pt can't take due to ulcers.  . Penicillins Other (See Comments)    "knocks out"    Social History   Substance and Sexual Activity  Alcohol Use No    Social History   Tobacco Use  Smoking Status Current Every Day Smoker  . Packs/day: 2.00  . Years: 40.00  . Pack years: 80.00  . Types: Cigarettes  Smokeless Tobacco Never Used    Review of Systems  Constitutional: Negative.   HENT: Negative.   Eyes: Negative.   Respiratory: Positive for shortness of breath and wheezing.   Cardiovascular: Negative.   Gastrointestinal: Negative.   Genitourinary: Negative.   Musculoskeletal: Negative.   Skin: Negative.   Neurological: Negative.   Endo/Heme/Allergies: Negative.   Psychiatric/Behavioral: Negative.  Objective   Vitals:   03/22/18 1128  BP: (!) 153/86  Pulse: 84  Resp: 18  Temp: 98.4 F (36.9 C)    Physical Exam  Constitutional: He is oriented to person, place, and time. He appears well-developed and well-nourished. No distress.  HENT:  Head: Normocephalic and atraumatic.  Cardiovascular: Normal rate, regular rhythm and normal heart sounds. Exam reveals no gallop and no friction rub.  No murmur heard. Pulmonary/Chest: Effort normal. No stridor. No respiratory distress. He has wheezes. He has no rales.  Abdominal: Soft. Bowel sounds are normal. He exhibits no distension and no mass. There is no tenderness. There is no rebound and no guarding.  Genitourinary:   Genitourinary Comments: A palpable firm masses noted on digital examination just at the tip of my index finger.  Prostate seems within normal limits.  Sphincter tone is fair.  No active bleeding noted.  Neurological: He is alert and oriented to person, place, and time.  Skin: Skin is warm and dry.  Vitals reviewed. Colonoscopy report reviewed.  Final pathology report reviewed.  Assessment  Rectal mass, concerning for malignancy. Plan   Due to the firm rectal mass seen on colonoscopy, the possibility of malignancy is high.  This was explained to the patient and his son.  Agree with proceeding with a low anterior resection on 04/02/2018.  The risks and benefits of the procedure including bleeding, infection, cardiopulmonary difficulties, anastomotic leak, and the possibility of a colostomy were fully explained to the patient, who gave informed consent.  GoLYTELY, Flagyl, neomycin have all been prescribed.

## 2018-03-22 NOTE — Progress Notes (Signed)
Tim Williams; [id number]; 11-12-1950   HPI Patient is a 67 year old white male who was referred to my care by Dr. Edrick Oh and [facility] for evaluation treatment of a rectal mass.  Patient had been passing some blood per rectum.  He underwent a colonoscopy by Dr. Laural Golden and was found to have a large hard rectal mass in the upper portion of his rectum.  Biopsies were done which revealed only tubular adenoma.  He had other tubular adenomas in the ascending colon which were removed.  Patient states he has had mucousy diarrhea and some blood in his stools for many weeks.  He denies any family history of colon cancer.  He currently has no abdominal pain.  He does suffer from COPD which seems to be somewhat stable at this time. Past Medical History:  Diagnosis Date  . Arthritis   . COPD (chronic obstructive pulmonary disease) ([place])   . Diabetes ([place])   . Gastric ulcer    2009  . Hypertension   . Shortness of breath     Past Surgical History:  Procedure Laterality Date  . BACK SURGERY     lumbar discectomy  . BIOPSY  02/23/2018   Procedure: BIOPSY;  Surgeon: Rogene Houston, MD;  [facility];  Service: Endoscopy;;  colon  . COLONOSCOPY WITH PROPOFOL N/A 02/23/2018   Procedure: COLONOSCOPY WITH PROPOFOL;  Surgeon: Rogene Houston, MD;  [facility];  Service: Endoscopy;  Laterality: N/A;  2:10  . CYSTOSCOPY     stone removal  . EYE SURGERY     muscle repair right eye-age 39  . HERNIA REPAIR     left age 32  . POLYPECTOMY  02/23/2018   Procedure: POLYPECTOMY;  Surgeon: Rogene Houston, MD;  [facility];  Service: Endoscopy;;  colon  . TOTAL KNEE ARTHROPLASTY  05/17/2012   Procedure: TOTAL KNEE ARTHROPLASTY;  Surgeon: Sanjuana Kava, MD;  Location: [facility];  Service: Orthopedics;  Laterality: Right;  . TRANSURETHRAL RESECTION OF PROSTATE      History reviewed. No pertinent family history.  Current Outpatient Medications on File Prior to Visit  Medication Sig  Dispense Refill  . amLODipine (NORVASC) 5 MG tablet Take 5 mg by mouth daily.    . diphenoxylate-atropine (LOMOTIL) 2.5-0.025 MG tablet Take by mouth 4 (four) times daily as needed for diarrhea or loose stools.    Marland Kitchen glipiZIDE (GLUCOTROL) 5 MG tablet Take by mouth daily before breakfast.    . potassium chloride (K-DUR) 10 MEQ tablet Take 10 mEq by mouth 2 (two) times daily.     No current facility-administered medications on file prior to visit.     Allergies  Allergen Reactions  . Ibuprofen     Pt can't take due to ulcers.  . Penicillins Other (See Comments)    "knocks out"    Social History   Substance and Sexual Activity  Alcohol Use No    Social History   Tobacco Use  Smoking Status Current Every Day Smoker  . Packs/day: 2.00  . Years: 40.00  . Pack years: 80.00  . Types: Cigarettes  Smokeless Tobacco Never Used    Review of Systems  Constitutional: Negative.   HENT: Negative.   Eyes: Negative.   Respiratory: Positive for shortness of breath and wheezing.   Cardiovascular: Negative.   Gastrointestinal: Negative.   Genitourinary: Negative.   Musculoskeletal: Negative.   Skin: Negative.   Neurological: Negative.   Endo/Heme/Allergies: Negative.   Psychiatric/Behavioral: Negative.  Objective   Vitals:   03/22/18 1128  BP: (!) 153/86  Pulse: 84  Resp: 18  Temp: 98.4 F (36.9 C)    Physical Exam  Constitutional: He is oriented to person, place, and time. He appears well-developed and well-nourished. No distress.  HENT:  Head: Normocephalic and atraumatic.  Cardiovascular: Normal rate, regular rhythm and normal heart sounds. Exam reveals no gallop and no friction rub.  No murmur heard. Pulmonary/Chest: Effort normal. No stridor. No respiratory distress. He has wheezes. He has no rales.  Abdominal: Soft. Bowel sounds are normal. He exhibits no distension and no mass. There is no tenderness. There is no rebound and no guarding.  Genitourinary:   Genitourinary Comments: A palpable firm masses noted on digital examination just at the tip of my index finger.  Prostate seems within normal limits.  Sphincter tone is fair.  No active bleeding noted.  Neurological: He is alert and oriented to person, place, and time.  Skin: Skin is warm and dry.  Vitals reviewed. Colonoscopy report reviewed.  Final pathology report reviewed.  Assessment  Rectal mass, concerning for malignancy. Plan   Due to the firm rectal mass seen on colonoscopy, the possibility of malignancy is high.  This was explained to the patient and his son.  Agree with proceeding with a low anterior resection on 04/02/2018.  The risks and benefits of the procedure including bleeding, infection, cardiopulmonary difficulties, anastomotic leak, and the possibility of a colostomy were fully explained to the patient, who gave informed consent.  GoLYTELY, Flagyl, neomycin have all been prescribed.

## 2018-03-22 NOTE — Patient Instructions (Signed)
Open Colectomy An open colectomy is surgery to remove part or all of the large intestine (colon). This procedure may be used to treat several conditions, including:  Inflammation and infection of the colon (diverticulitis).  Tumors or masses in the colon.  Inflammatory bowel disease, such as Crohn disease or ulcerative colitis.  Bleeding from the colon.  Blockage or obstruction of the colon.  Tell a health care provider about:  Any allergies you have.  All medicines you are taking, including vitamins, herbs, eye drops, creams, and over-the-counter medicines.  Any problems you or family members have had with anesthetic medicines.  Any blood disorders you have.  Any surgeries you have had.  Any medical conditions you have.  Whether you are pregnant or may be pregnant.  Whether you smoke or use tobacco products. These can affect your body's reaction to anesthesia. What are the risks? Generally, this is a safe procedure. However, problems may occur, including:  Infection.  Bleeding.  Allergic reactions to medicines.  Damage to other structures or organs.  Pneumonia.  The incision opening up.  Tissues from inside the abdomen bulging through the incision (hernia).  Reopening of the colon where it was stitched or stapled together.  A blood clot forming in a vein and traveling to the lungs.  Future blockage of the small intestine from scar tissue.  What happens before the procedure? Staying hydrated Follow instructions from your health care provider about hydration, which may include:  Up to 2 hours before the procedure - you may continue to drink clear liquids, such as water, clear fruit juice, black coffee, and plain tea.  Eating and drinking restrictions Follow instructions from your health care provider about eating and drinking, which may include:  8 hours before the procedure - stop eating heavy meals or foods such as meat, fried foods, or fatty foods.  6  hours before the procedure - stop eating light meals or foods, such as toast or cereal.  6 hours before the procedure - stop drinking milk or drinks that contain milk.  2 hours before the procedure - stop drinking clear liquids.  Bowel prep In some cases, you may be prescribed an oral bowel prep to clean out your colon. If so:  Take it as told by your health care provider. Starting the day before your procedure, you may need to drink a large amount of medicated liquid. The liquid will cause you to have multiple loose stools until your stool is almost clear or light green.  Follow instructions from your health care provider about eating and drinking restrictions during bowel prep.  Medicines  Ask your health care provider about: ? Changing or stopping your regular medicines or vitamins. This is especially important if you are taking diabetes medicines, blood thinners, or vitamin E. ? Taking medicines such as aspirin and ibuprofen. These medicines can thin your blood. Do not take these medicines before your procedure if your health care provider instructs you not to.  If you were prescribed an antibiotic medicine, take it as told by your health care provider. General instructions  Bring loose-fitting, comfortable clothing and slip-on shoes that you can put on without bending over.  Make sure to see your health care provider for any tests that you need before the procedure, such as: ? Blood tests. ? A test to check the heart's rhythm (electrocardiogram, ECG). ? A CT scan of your abdomen. ? Urine tests. ? Colonoscopy.  Plan to have someone take you home from the   hospital or clinic.  Arrange for someone to help you with your activities during your recovery. What happens during the procedure?  To reduce your risk of infection: ? Your health care team will wash or sanitize their hands. ? Your skin will be washed with soap. ? Hair may be removed from the surgical area.  An IV tube  will be inserted into one of your veins. The tube will be used to give you medicines and fluids.  You will be given a medicine to make you fall asleep (general anesthetic). You may also be given a medicine to help you relax (sedative).  Small monitors will be connected to your body. They will be used to check your heart, blood pressure, and oxygen level.  A breathing tube may be placed into your lungs during the procedure.  A thin, flexible tube (catheter) will be placed into your bladder to drain urine.  A tube may be inserted through your nose and into your stomach (nasogastric tube, or NG tube). The tube is used to remove stomach fluids after surgery until the intestines start working again.  An incision will be made in your abdomen.  Clamps or staples will be put on your colon.  The part of the colon between the clamps or staples will be removed.  The ends of the colon that remain will be stitched or stapled together.  The incision in your abdomen will be closed with stitches (sutures) or staples.  The incision will be covered with a bandage (dressing).  A small opening (stoma) may be created in your lower abdomen. A removable, external pouch (ostomy pouch) will be attached to the stoma. This pouch will collect stool outside of your body. Stool passes through the stoma and into the pouch instead of through your anus. The procedure may vary among health care providers and hospitals. What happens after the procedure?  Your blood pressure, heart rate, breathing rate, and blood oxygen level will be monitored until the medicines you were given have worn off.  You may continue to receive fluids and medicines through an IV tube.  You will start on a clear liquid diet and gradually go back to a normal diet.  Do not drive until your health care provider approves.  You may have some pain in your abdomen. You will be given pain medicine to control the pain.  You will be encouraged to  do the following: ? Do breathing exercises to prevent pneumonia. ? Get up and start walking within a day after surgery. You should try to get up 5-6 times a day. This information is not intended to replace advice given to you by your health care provider. Make sure you discuss any questions you have with your health care provider. Document Released: 03/27/2009 Document Revised: 02/29/2016 Document Reviewed: 02/29/2016 [employer] Interactive Patient Education  2018 [employer]  

## 2018-03-26 NOTE — Patient Instructions (Signed)
Tim Williams  03/26/2018     @PREFPERIOPPHARMACY @   Your procedure is scheduled on  04/02/2018.  Report to [facility] at  900   A.M.  Call this number if you have problems the morning of surgery:  [phone]   Remember:  Do not eat or drink after midnight.                        Take these medicines the morning of surgery with A SIP OF WATER  Amlodipine.    Do not wear jewelry, make-up or nail polish.  Do not wear lotions, powders, or perfumes, or deodorant.  Do not shave 48 hours prior to surgery.  Men may shave face and neck.  Do not bring valuables to the hospital.  [facility] is not responsible for any belongings or valuables.  Contacts, dentures or bridgework may not be worn into surgery.  Leave your suitcase in the car.  After surgery it may be brought to your room.  For patients admitted to the hospital, discharge time will be determined by your treatment team.  Patients discharged the day of surgery will not be allowed to drive home.   Name and phone number of your driver:   Family Special instructions:  Follow the diet and prep instructions given to you by Dr Adline Mango office.  Please read over the following fact sheets that you were given. Pain Booklet, Coughing and Deep Breathing, Blood Transfusion Information, Lab Information, Surgical Site Infection Prevention, Anesthesia Post-op Instructions and Care and Recovery After Surgery       Open Colectomy An open colectomy is surgery to remove part or all of the large intestine (colon). This procedure may be used to treat several conditions, including:  Inflammation and infection of the colon (diverticulitis).  Tumors or masses in the colon.  Inflammatory bowel disease, such as Crohn disease or ulcerative colitis.  Bleeding from the colon.  Blockage or obstruction of the colon.  Tell a health care provider about:  Any allergies you have.  All medicines you are taking, including  vitamins, herbs, eye drops, creams, and over-the-counter medicines.  Any problems you or family members have had with anesthetic medicines.  Any blood disorders you have.  Any surgeries you have had.  Any medical conditions you have.  Whether you are pregnant or may be pregnant.  Whether you smoke or use tobacco products. These can affect your body's reaction to anesthesia. What are the risks? Generally, this is a safe procedure. However, problems may occur, including:  Infection.  Bleeding.  Allergic reactions to medicines.  Damage to other structures or organs.  Pneumonia.  The incision opening up.  Tissues from inside the abdomen bulging through the incision (hernia).  Reopening of the colon where it was stitched or stapled together.  A blood clot forming in a vein and traveling to the lungs.  Future blockage of the small intestine from scar tissue.  What happens before the procedure? Staying hydrated Follow instructions from your health care provider about hydration, which may include:  Up to 2 hours before the procedure - you may continue to drink clear liquids, such as water, clear fruit juice, black coffee, and plain tea.  Eating and drinking restrictions Follow instructions from your health care provider about eating and drinking, which may include:  8 hours before the procedure - stop eating heavy meals or foods such as meat,  fried foods, or fatty foods.  6 hours before the procedure - stop eating light meals or foods, such as toast or cereal.  6 hours before the procedure - stop drinking milk or drinks that contain milk.  2 hours before the procedure - stop drinking clear liquids.  Bowel prep In some cases, you may be prescribed an oral bowel prep to clean out your colon. If so:  Take it as told by your health care provider. Starting the day before your procedure, you may need to drink a large amount of medicated liquid. The liquid will cause you to  have multiple loose stools until your stool is almost clear or light green.  Follow instructions from your health care provider about eating and drinking restrictions during bowel prep.  Medicines  Ask your health care provider about: ? Changing or stopping your regular medicines or vitamins. This is especially important if you are taking diabetes medicines, blood thinners, or vitamin E. ? Taking medicines such as aspirin and ibuprofen. These medicines can thin your blood. Do not take these medicines before your procedure if your health care provider instructs you not to.  If you were prescribed an antibiotic medicine, take it as told by your health care provider. General instructions  Bring loose-fitting, comfortable clothing and slip-on shoes that you can put on without bending over.  Make sure to see your health care provider for any tests that you need before the procedure, such as: ? Blood tests. ? A test to check the heart's rhythm (electrocardiogram, ECG). ? A CT scan of your abdomen. ? Urine tests. ? Colonoscopy.  Plan to have someone take you home from the hospital or clinic.  Arrange for someone to help you with your activities during your recovery. What happens during the procedure?  To reduce your risk of infection: ? Your health care team will wash or sanitize their hands. ? Your skin will be washed with soap. ? Hair may be removed from the surgical area.  An IV tube will be inserted into one of your veins. The tube will be used to give you medicines and fluids.  You will be given a medicine to make you fall asleep (general anesthetic). You may also be given a medicine to help you relax (sedative).  Small monitors will be connected to your body. They will be used to check your heart, blood pressure, and oxygen level.  A breathing tube may be placed into your lungs during the procedure.  A thin, flexible tube (catheter) will be placed into your bladder to drain  urine.  A tube may be inserted through your nose and into your stomach (nasogastric tube, or NG tube). The tube is used to remove stomach fluids after surgery until the intestines start working again.  An incision will be made in your abdomen.  Clamps or staples will be put on your colon.  The part of the colon between the clamps or staples will be removed.  The ends of the colon that remain will be stitched or stapled together.  The incision in your abdomen will be closed with stitches (sutures) or staples.  The incision will be covered with a bandage (dressing).  A small opening (stoma) may be created in your lower abdomen. A removable, external pouch (ostomy pouch) will be attached to the stoma. This pouch will collect stool outside of your body. Stool passes through the stoma and into the pouch instead of through your anus. The procedure may vary among  health care providers and hospitals. What happens after the procedure?  Your blood pressure, heart rate, breathing rate, and blood oxygen level will be monitored until the medicines you were given have worn off.  You may continue to receive fluids and medicines through an IV tube.  You will start on a clear liquid diet and gradually go back to a normal diet.  Do not drive until your health care provider approves.  You may have some pain in your abdomen. You will be given pain medicine to control the pain.  You will be encouraged to do the following: ? Do breathing exercises to prevent pneumonia. ? Get up and start walking within a day after surgery. You should try to get up 5-6 times a day. This information is not intended to replace advice given to you by your health care provider. Make sure you discuss any questions you have with your health care provider. Document Released: 03/27/2009 Document Revised: 02/29/2016 Document Reviewed: 02/29/2016 Elsevier Interactive Patient Education  2018 [employer].  Open Colectomy, Care  After This sheet gives you information about how to care for yourself after your procedure. Your health care provider may also give you more specific instructions. If you have problems or questions, contact your health care provider. What can I expect after the procedure? After the procedure, it is common to have:  Pain in your abdomen, especially along your incision.  Tiredness. Your energy level will return to normal over the next several weeks.  Constipation.  Nausea.  Difficulty urinating.  Follow these instructions at home: Activity  You may be able to return to most of your normal activities within 1-2 weeks, such as working, walking up stairs, and sexual activity.  Avoid activities that require a lot of energy for 4-6 weeks after surgery, such as running, climbing, and lifting heavy objects. Ask your health care provider what activities are safe for you.  Take rest breaks during the day as needed.  Do not drive for 1-2 weeks or until your health care provider says that it is safe.  Do not drive or use heavy machinery while taking prescription pain medicines.  Do not lift anything that is heavier than 10 lb (4.3 kg) until your health care provider says that it is safe. Incision care  Follow instructions from your health care provider about how to take care of your incision. Make sure you: ? Wash your hands with soap and water before you change your bandage (dressing). If soap and water are not available, use hand sanitizer. ? Change your dressing as told by your health care provider. ? Leave stitches (sutures) or staples in place. These skin closures may need to stay in place for 2 weeks or longer.  Avoid wearing tight clothing around your incision.  Protect your incision area from the sun.  Check your incision area every day for signs of infection. Check for: ? More redness, swelling, or pain. ? More fluid or blood. ? Warmth. ? Pus or a bad smell. General  instructions  Do not take baths, swim, or use a hot tub until your health care provider approves. Ask your health care provider when you may shower.  Take over-the-counter and prescription medicines, including stool softeners, only as told by your health care provider.  Eat a low-fat and low-fiber diet for the first 4 weeks after surgery.  Keep all follow-up visits as told by your health care provider. This is important. Contact a health care provider if:  You  have more redness, swelling, or pain around your incision.  You have more fluid or blood coming from your incision.  Your incision feels warm to the touch.  You have pus or a bad smell coming from your incision.  You have a fever or chills.  You do not have a bowel movement 2-3 days after surgery.  You cannot eat or drink for 24 hours or more.  You have persistent nausea and vomiting.  You have abdominal pain that gets worse and does not get better with medicine. Get help right away if:  You have chest pain.  You have shortness of breath.  You have pain or swelling in your legs.  Your incision breaks open after your sutures or staples have been removed.  You have bleeding from the rectum. This information is not intended to replace advice given to you by your health care provider. Make sure you discuss any questions you have with your health care provider. Document Released: 12/21/2010 Document Revised: 02/29/2016 Document Reviewed: 02/29/2016 Elsevier Interactive Patient Education  2018 [place] Anesthesia, Adult General anesthesia is the use of medicines to make a person "go to sleep" (be unconscious) for a medical procedure. General anesthesia is often recommended when a procedure:  Is long.  Requires you to be still or in an unusual position.  Is major and can cause you to lose blood.  Is impossible to do without general anesthesia.  The medicines used for general anesthesia are called  general anesthetics. In addition to making you sleep, the medicines:  Prevent pain.  Control your blood pressure.  Relax your muscles.  Tell a health care provider about:  Any allergies you have.  All medicines you are taking, including vitamins, herbs, eye drops, creams, and over-the-counter medicines.  Any problems you or family members have had with anesthetic medicines.  Types of anesthetics you have had in the past.  Any bleeding disorders you have.  Any surgeries you have had.  Any medical conditions you have.  Any history of heart or lung conditions, such as heart failure, sleep apnea, or chronic obstructive pulmonary disease (COPD).  Whether you are pregnant or may be pregnant.  Whether you use tobacco, alcohol, marijuana, or street drugs.  Any history of Armed forces logistics/support/administrative officer.  Any history of depression or anxiety. What are the risks? Generally, this is a safe procedure. However, problems may occur, including:  Allergic reaction to anesthetics.  Lung and heart problems.  Inhaling food or liquids from your stomach into your lungs (aspiration).  Injury to nerves.  Waking up during your procedure and being unable to move (rare).  Extreme agitation or a state of mental confusion (delirium) when you wake up from the anesthetic.  Air in the bloodstream, which can lead to stroke.  These problems are more likely to develop if you are having a major surgery or if you have an advanced medical condition. You can prevent some of these complications by answering all of your health care provider's questions thoroughly and by following all pre-procedure instructions. General anesthesia can cause side effects, including:  Nausea or vomiting  A sore throat from the breathing tube.  Feeling cold or shivery.  Feeling tired, washed out, or achy.  Sleepiness or drowsiness.  Confusion or agitation.  What happens before the procedure? Staying hydrated Follow  instructions from your health care provider about hydration, which may include:  Up to 2 hours before the procedure - you may continue to drink clear liquids,  such as water, clear fruit juice, black coffee, and plain tea.  Eating and drinking restrictions Follow instructions from your health care provider about eating and drinking, which may include:  8 hours before the procedure - stop eating heavy meals or foods such as meat, fried foods, or fatty foods.  6 hours before the procedure - stop eating light meals or foods, such as toast or cereal.  6 hours before the procedure - stop drinking milk or drinks that contain milk.  2 hours before the procedure - stop drinking clear liquids.  Medicines  Ask your health care provider about: ? Changing or stopping your regular medicines. This is especially important if you are taking diabetes medicines or blood thinners. ? Taking medicines such as aspirin and ibuprofen. These medicines can thin your blood. Do not take these medicines before your procedure if your health care provider instructs you not to. ? Taking new dietary supplements or medicines. Do not take these during the week before your procedure unless your health care provider approves them.  If you are told to take a medicine or to continue taking a medicine on the day of the procedure, take the medicine with sips of water. General instructions   Ask if you will be going home the same day, the following day, or after a longer hospital stay. ? Plan to have someone take you home. ? Plan to have someone stay with you for the first 24 hours after you leave the hospital or clinic.  For 3-6 weeks before the procedure, try not to use any tobacco products, such as cigarettes, chewing tobacco, and e-cigarettes.  You may brush your teeth on the morning of the procedure, but make sure to spit out the toothpaste. What happens during the procedure?  You will be given anesthetics through a  mask and through an IV tube in one of your veins.  You may receive medicine to help you relax (sedative).  As soon as you are asleep, a breathing tube may be used to help you breathe.  An anesthesia specialist will stay with you throughout the procedure. He or she will help keep you comfortable and safe by continuing to give you medicines and adjusting the amount of medicine that you get. He or she will also watch your blood pressure, pulse, and oxygen levels to make sure that the anesthetics do not cause any problems.  If a breathing tube was used to help you breathe, it will be removed before you wake up. The procedure may vary among health care providers and hospitals. What happens after the procedure?  You will wake up, often slowly, after the procedure is complete, usually in a recovery area.  Your blood pressure, heart rate, breathing rate, and blood oxygen level will be monitored until the medicines you were given have worn off.  You may be given medicine to help you calm down if you feel anxious or agitated.  If you will be going home the same day, your health care provider may check to make sure you can stand, drink, and urinate.  Your health care providers will treat your pain and side effects before you go home.  Do not drive for 24 hours if you received a sedative.  You may: ? Feel nauseous and vomit. ? Have a sore throat. ? Have mental slowness. ? Feel cold or shivery. ? Feel sleepy. ? Feel tired. ? Feel sore or achy, even in parts of your body where you did not have  surgery. This information is not intended to replace advice given to you by your health care provider. Make sure you discuss any questions you have with your health care provider. Document Released: 09/06/2007 Document Revised: 11/10/2015 Document Reviewed: 05/14/2015 Elsevier Interactive Patient Education  2018 [place] Anesthesia, Adult, Care After These instructions provide you with  information about caring for yourself after your procedure. Your health care provider may also give you more specific instructions. Your treatment has been planned according to current medical practices, but problems sometimes occur. Call your health care provider if you have any problems or questions after your procedure. What can I expect after the procedure? After the procedure, it is common to have:  Vomiting.  A sore throat.  Mental slowness.  It is common to feel:  Nauseous.  Cold or shivery.  Sleepy.  Tired.  Sore or achy, even in parts of your body where you did not have surgery.  Follow these instructions at home: For at least 24 hours after the procedure:  Do not: ? Participate in activities where you could fall or become injured. ? Drive. ? Use heavy machinery. ? Drink alcohol. ? Take sleeping pills or medicines that cause drowsiness. ? Make important decisions or sign legal documents. ? Take care of children on your own.  Rest. Eating and drinking  If you vomit, drink water, juice, or soup when you can drink without vomiting.  Drink enough fluid to keep your urine clear or pale yellow.  Make sure you have little or no nausea before eating solid foods.  Follow the diet recommended by your health care provider. General instructions  Have a responsible adult stay with you until you are awake and alert.  Return to your normal activities as told by your health care provider. Ask your health care provider what activities are safe for you.  Take over-the-counter and prescription medicines only as told by your health care provider.  If you smoke, do not smoke without supervision.  Keep all follow-up visits as told by your health care provider. This is important. Contact a health care provider if:  You continue to have nausea or vomiting at home, and medicines are not helpful.  You cannot drink fluids or start eating again.  You cannot urinate after  8-12 hours.  You develop a skin rash.  You have fever.  You have increasing redness at the site of your procedure. Get help right away if:  You have difficulty breathing.  You have chest pain.  You have unexpected bleeding.  You feel that you are having a life-threatening or urgent problem. This information is not intended to replace advice given to you by your health care provider. Make sure you discuss any questions you have with your health care provider. Document Released: 09/05/2000 Document Revised: 11/02/2015 Document Reviewed: 05/14/2015 Elsevier Interactive Patient Education  [employer].

## 2018-03-29 ENCOUNTER — Ambulatory Visit (HOSPITAL_COMMUNITY)
Admission: RE | Admit: 2018-03-29 | Discharge: 2018-03-29 | Disposition: A | Discharge status: Home / Self Care | Payer: Medicare Other | Source: Ambulatory Visit | Attending: General Surgery | Admitting: General Surgery

## 2018-03-29 ENCOUNTER — Encounter (HOSPITAL_COMMUNITY): Payer: Self-pay

## 2018-03-29 ENCOUNTER — Encounter (HOSPITAL_COMMUNITY)
Admission: RE | Admit: 2018-03-29 | Discharge: 2018-03-29 | Disposition: A | Discharge status: Home / Self Care | Payer: Medicare Other | Source: Ambulatory Visit | Attending: General Surgery | Admitting: General Surgery

## 2018-03-29 DIAGNOSIS — Z01818 Encounter for other preprocedural examination: Secondary | ICD-10-CM | POA: Insufficient documentation

## 2018-03-29 DIAGNOSIS — R079 Chest pain, unspecified: Secondary | ICD-10-CM | POA: Insufficient documentation

## 2018-03-29 DIAGNOSIS — E119 Type 2 diabetes mellitus without complications: Secondary | ICD-10-CM | POA: Diagnosis not present

## 2018-03-29 DIAGNOSIS — K6289 Other specified diseases of anus and rectum: Secondary | ICD-10-CM

## 2018-03-29 DIAGNOSIS — I10 Essential (primary) hypertension: Secondary | ICD-10-CM | POA: Insufficient documentation

## 2018-03-29 DIAGNOSIS — J449 Chronic obstructive pulmonary disease, unspecified: Secondary | ICD-10-CM | POA: Diagnosis not present

## 2018-03-29 HISTORY — DX: Other injury of unspecified body region, subsequent encounter: T14.8XXD

## 2018-03-29 LAB — HEMOGLOBIN A1C
Hgb A1c MFr Bld: 7 % — ABNORMAL HIGH (ref 4.8–5.6)
Mean Plasma Glucose: 154.2 mg/dL

## 2018-03-29 LAB — COMPREHENSIVE METABOLIC PANEL
ALK PHOS: 56 U/L (ref 38–126)
ALT: 20 U/L (ref 0–44)
AST: 16 U/L (ref 15–41)
Albumin: 4.2 g/dL (ref 3.5–5.0)
Anion gap: 7 (ref 5–15)
BUN: 19 mg/dL (ref 8–23)
CALCIUM: 9.5 mg/dL (ref 8.9–10.3)
CO2: 28 mmol/L (ref 22–32)
Chloride: 105 mmol/L (ref 98–111)
Creatinine, Ser: 0.96 mg/dL (ref 0.61–1.24)
Glucose, Bld: 78 mg/dL (ref 70–99)
Potassium: 3.6 mmol/L (ref 3.5–5.1)
Sodium: 140 mmol/L (ref 135–145)
Total Bilirubin: 0.7 mg/dL (ref 0.3–1.2)
Total Protein: 7.6 g/dL (ref 6.5–8.1)

## 2018-03-29 LAB — CBC WITH DIFFERENTIAL/PLATELET
ABS IMMATURE GRANULOCYTES: 0.06 10*3/uL (ref 0.00–0.07)
BASOS PCT: 1 %
Basophils Absolute: 0.1 10*3/uL (ref 0.0–0.1)
EOS PCT: 1 %
Eosinophils Absolute: 0.1 10*3/uL (ref 0.0–0.5)
HCT: 51.5 % (ref 39.0–52.0)
HEMOGLOBIN: 16.7 g/dL (ref 13.0–17.0)
Immature Granulocytes: 1 %
Lymphocytes Relative: 21 %
Lymphs Abs: 2.3 10*3/uL (ref 0.7–4.0)
MCH: 31.6 pg (ref 26.0–34.0)
MCHC: 32.4 g/dL (ref 30.0–36.0)
MCV: 97.5 fL (ref 80.0–100.0)
MONO ABS: 0.7 10*3/uL (ref 0.1–1.0)
MONOS PCT: 7 %
NEUTROS ABS: 7.6 10*3/uL (ref 1.7–7.7)
NEUTROS PCT: 69 %
PLATELETS: 272 10*3/uL (ref 150–400)
RBC: 5.28 MIL/uL (ref 4.22–5.81)
RDW: 12.5 % (ref 11.5–15.5)
WBC: 10.8 10*3/uL — AB (ref 4.0–10.5)
nRBC: 0 % (ref 0.0–0.2)

## 2018-03-29 LAB — GLUCOSE, CAPILLARY: Glucose-Capillary: 86 mg/dL (ref 70–99)

## 2018-03-29 NOTE — Progress Notes (Addendum)
Patient arrived to PAT.  He stated he fell on Sunday and his chest hurts when he takes a deep breath.  No other injuries with fall.   Dr Rick Duff notified. Patient for a chest x ray.   No orders given.   Dr Arnoldo Morale notified he has a non healing wound on his left upper shoulder

## 2018-03-30 LAB — CEA: CEA: 9.3 ng/mL — ABNORMAL HIGH (ref 0.0–4.7)

## 2018-03-30 NOTE — Pre-Procedure Instructions (Signed)
HgbA1C routed to PCP. 

## 2018-04-02 ENCOUNTER — Other Ambulatory Visit: Payer: Self-pay

## 2018-04-02 ENCOUNTER — Inpatient Hospital Stay (HOSPITAL_COMMUNITY): Payer: Medicare Other | Admitting: Anesthesiology

## 2018-04-02 ENCOUNTER — Inpatient Hospital Stay (HOSPITAL_COMMUNITY)
Admission: RE | Admit: 2018-04-02 | Discharge: 2018-04-06 | DRG: 331 | Disposition: A | Discharge status: Home / Self Care | Payer: Medicare Other | Attending: General Surgery | Admitting: General Surgery

## 2018-04-02 ENCOUNTER — Encounter (HOSPITAL_COMMUNITY): Payer: Self-pay

## 2018-04-02 ENCOUNTER — Encounter (HOSPITAL_COMMUNITY)
Admission: RE | Disposition: A | Discharge status: Home / Self Care | Payer: Self-pay | Source: Home / Self Care | Attending: General Surgery

## 2018-04-02 DIAGNOSIS — D128 Benign neoplasm of rectum: Secondary | ICD-10-CM | POA: Diagnosis present

## 2018-04-02 DIAGNOSIS — Z79899 Other long term (current) drug therapy: Secondary | ICD-10-CM

## 2018-04-02 DIAGNOSIS — Z8711 Personal history of peptic ulcer disease: Secondary | ICD-10-CM

## 2018-04-02 DIAGNOSIS — T148XXA Other injury of unspecified body region, initial encounter: Secondary | ICD-10-CM | POA: Diagnosis not present

## 2018-04-02 DIAGNOSIS — F17210 Nicotine dependence, cigarettes, uncomplicated: Secondary | ICD-10-CM | POA: Diagnosis present

## 2018-04-02 DIAGNOSIS — I10 Essential (primary) hypertension: Secondary | ICD-10-CM | POA: Diagnosis present

## 2018-04-02 DIAGNOSIS — Z7984 Long term (current) use of oral hypoglycemic drugs: Secondary | ICD-10-CM

## 2018-04-02 DIAGNOSIS — E119 Type 2 diabetes mellitus without complications: Secondary | ICD-10-CM | POA: Diagnosis present

## 2018-04-02 DIAGNOSIS — Z9049 Acquired absence of other specified parts of digestive tract: Secondary | ICD-10-CM

## 2018-04-02 DIAGNOSIS — Z880 Allergy status to penicillin: Secondary | ICD-10-CM | POA: Diagnosis not present

## 2018-04-02 DIAGNOSIS — E876 Hypokalemia: Secondary | ICD-10-CM | POA: Diagnosis present

## 2018-04-02 DIAGNOSIS — Z9079 Acquired absence of other genital organ(s): Secondary | ICD-10-CM | POA: Diagnosis not present

## 2018-04-02 DIAGNOSIS — Z886 Allergy status to analgesic agent status: Secondary | ICD-10-CM

## 2018-04-02 DIAGNOSIS — J449 Chronic obstructive pulmonary disease, unspecified: Secondary | ICD-10-CM | POA: Diagnosis present

## 2018-04-02 DIAGNOSIS — K429 Umbilical hernia without obstruction or gangrene: Secondary | ICD-10-CM | POA: Diagnosis present

## 2018-04-02 DIAGNOSIS — Z96651 Presence of right artificial knee joint: Secondary | ICD-10-CM | POA: Diagnosis present

## 2018-04-02 DIAGNOSIS — K621 Rectal polyp: Principal | ICD-10-CM | POA: Diagnosis present

## 2018-04-02 DIAGNOSIS — C44629 Squamous cell carcinoma of skin of left upper limb, including shoulder: Secondary | ICD-10-CM | POA: Diagnosis present

## 2018-04-02 HISTORY — PX: PARTIAL COLECTOMY: SHX5273

## 2018-04-02 LAB — GLUCOSE, CAPILLARY
GLUCOSE-CAPILLARY: 143 mg/dL — AB (ref 70–99)
Glucose-Capillary: 130 mg/dL — ABNORMAL HIGH (ref 70–99)
Glucose-Capillary: 125 mg/dL — ABNORMAL HIGH (ref 70–99)
Glucose-Capillary: 130 mg/dL — ABNORMAL HIGH (ref 70–99)
Glucose-Capillary: 177 mg/dL — ABNORMAL HIGH (ref 70–99)

## 2018-04-02 LAB — PREPARE RBC (CROSSMATCH)

## 2018-04-02 SURGERY — COLECTOMY, PARTIAL
Anesthesia: General

## 2018-04-02 MED ORDER — DIPHENHYDRAMINE HCL 50 MG/ML IJ SOLN: 12.5000 mg | Freq: Four times a day (QID) | INTRAMUSCULAR | Status: DC | PRN

## 2018-04-02 MED ORDER — HYDROMORPHONE HCL 1 MG/ML IJ SOLN
0.2500 mg | INTRAMUSCULAR | Status: DC | PRN
  Administered 2018-04-02 (×4): 0.5 mg via INTRAVENOUS
  Filled 2018-04-02 (×4): qty 0.5

## 2018-04-02 MED ORDER — ALVIMOPAN 12 MG PO CAPS
12.0000 mg | ORAL_CAPSULE | Freq: Two times a day (BID) | ORAL | Status: DC
  Administered 2018-04-03 – 2018-04-05 (×4): 12 mg via ORAL
  Filled 2018-04-02 (×5): qty 1

## 2018-04-02 MED ORDER — POVIDONE-IODINE 10 % OINT PACKET
TOPICAL_OINTMENT | CUTANEOUS | Status: DC | PRN
  Administered 2018-04-02: 1 via TOPICAL

## 2018-04-02 MED ORDER — CIPROFLOXACIN IN D5W 400 MG/200ML IV SOLN
400.0000 mg | INTRAVENOUS | Status: AC
  Administered 2018-04-02: 400 mg via INTRAVENOUS
  Filled 2018-04-02: qty 200

## 2018-04-02 MED ORDER — CHLORHEXIDINE GLUCONATE CLOTH 2 % EX PADS: 6.0000 | MEDICATED_PAD | Freq: Once | CUTANEOUS | Status: DC

## 2018-04-02 MED ORDER — FENTANYL CITRATE (PF) 250 MCG/5ML IJ SOLN
INTRAMUSCULAR | Status: AC
  Filled 2018-04-02: qty 5

## 2018-04-02 MED ORDER — GLIPIZIDE 5 MG PO TABS
10.0000 mg | ORAL_TABLET | Freq: Every day | ORAL | Status: DC
  Administered 2018-04-03 – 2018-04-06 (×4): 10 mg via ORAL
  Filled 2018-04-02 (×4): qty 2

## 2018-04-02 MED ORDER — ONDANSETRON HCL 4 MG/2ML IJ SOLN
INTRAMUSCULAR | Status: AC
  Filled 2018-04-02: qty 2

## 2018-04-02 MED ORDER — MIDAZOLAM HCL 2 MG/2ML IJ SOLN
INTRAMUSCULAR | Status: AC
  Filled 2018-04-02: qty 2

## 2018-04-02 MED ORDER — METRONIDAZOLE IN NACL 5-0.79 MG/ML-% IV SOLN
500.0000 mg | INTRAVENOUS | Status: AC
  Administered 2018-04-02: 500 mg via INTRAVENOUS
  Filled 2018-04-02: qty 100

## 2018-04-02 MED ORDER — ENOXAPARIN SODIUM 40 MG/0.4ML ~~LOC~~ SOLN
40.0000 mg | Freq: Once | SUBCUTANEOUS | Status: AC
  Administered 2018-04-02: 40 mg via SUBCUTANEOUS
  Filled 2018-04-02: qty 0.4

## 2018-04-02 MED ORDER — LORAZEPAM 2 MG/ML IJ SOLN: 1.0000 mg | INTRAMUSCULAR | Status: DC | PRN

## 2018-04-02 MED ORDER — LACTATED RINGERS IV SOLN
INTRAVENOUS | Status: DC
  Administered 2018-04-02 (×3): via INTRAVENOUS

## 2018-04-02 MED ORDER — SODIUM CHLORIDE 0.9 % IV SOLN
INTRAVENOUS | Status: DC
  Administered 2018-04-02 – 2018-04-03 (×2): via INTRAVENOUS

## 2018-04-02 MED ORDER — LIDOCAINE HCL (CARDIAC) PF 50 MG/5ML IV SOSY
PREFILLED_SYRINGE | INTRAVENOUS | Status: DC | PRN
  Administered 2018-04-02: 40 mg via INTRAVENOUS

## 2018-04-02 MED ORDER — MIDAZOLAM HCL 2 MG/2ML IJ SOLN: 0.5000 mg | Freq: Once | INTRAMUSCULAR | Status: DC | PRN

## 2018-04-02 MED ORDER — EPHEDRINE SULFATE 50 MG/ML IJ SOLN
INTRAMUSCULAR | Status: AC
  Filled 2018-04-02: qty 1

## 2018-04-02 MED ORDER — BUPIVACAINE LIPOSOME 1.3 % IJ SUSP
INTRAMUSCULAR | Status: AC
  Filled 2018-04-02: qty 20

## 2018-04-02 MED ORDER — DIPHENHYDRAMINE HCL 12.5 MG/5ML PO ELIX: 12.5000 mg | ORAL_SOLUTION | Freq: Four times a day (QID) | ORAL | Status: DC | PRN

## 2018-04-02 MED ORDER — ONDANSETRON HCL 4 MG/2ML IJ SOLN
INTRAMUSCULAR | Status: DC | PRN
  Administered 2018-04-02: 4 mg via INTRAVENOUS

## 2018-04-02 MED ORDER — PROMETHAZINE HCL 25 MG/ML IJ SOLN: 6.2500 mg | INTRAMUSCULAR | Status: DC | PRN

## 2018-04-02 MED ORDER — INSULIN ASPART 100 UNIT/ML ~~LOC~~ SOLN
0.0000 [IU] | Freq: Three times a day (TID) | SUBCUTANEOUS | Status: DC
  Administered 2018-04-02 – 2018-04-04 (×2): 2 [IU] via SUBCUTANEOUS

## 2018-04-02 MED ORDER — OXYCODONE-ACETAMINOPHEN 5-325 MG PO TABS
1.0000 | ORAL_TABLET | ORAL | Status: DC | PRN
  Administered 2018-04-04 – 2018-04-06 (×3): 1 via ORAL
  Filled 2018-04-02 (×3): qty 1

## 2018-04-02 MED ORDER — SODIUM CHLORIDE 0.9 % IJ SOLN
INTRAMUSCULAR | Status: AC
  Filled 2018-04-02: qty 10

## 2018-04-02 MED ORDER — SIMETHICONE 80 MG PO CHEW
40.0000 mg | CHEWABLE_TABLET | Freq: Four times a day (QID) | ORAL | Status: DC | PRN
  Administered 2018-04-03: 40 mg via ORAL
  Filled 2018-04-02: qty 1

## 2018-04-02 MED ORDER — SUGAMMADEX SODIUM 200 MG/2ML IV SOLN
INTRAVENOUS | Status: AC
  Filled 2018-04-02: qty 2

## 2018-04-02 MED ORDER — ACETAMINOPHEN 325 MG PO TABS: 650.0000 mg | ORAL_TABLET | Freq: Four times a day (QID) | ORAL | Status: DC | PRN

## 2018-04-02 MED ORDER — SUCCINYLCHOLINE CHLORIDE 20 MG/ML IJ SOLN
INTRAMUSCULAR | Status: DC | PRN
  Administered 2018-04-02: 120 mg via INTRAVENOUS

## 2018-04-02 MED ORDER — FENTANYL CITRATE (PF) 100 MCG/2ML IJ SOLN
INTRAMUSCULAR | Status: DC | PRN
  Administered 2018-04-02 (×5): 50 ug via INTRAVENOUS

## 2018-04-02 MED ORDER — BUPIVACAINE LIPOSOME 1.3 % IJ SUSP
INTRAMUSCULAR | Status: DC | PRN
  Administered 2018-04-02: 20 mL

## 2018-04-02 MED ORDER — HYDROCODONE-ACETAMINOPHEN 7.5-325 MG PO TABS: 1.0000 | ORAL_TABLET | Freq: Once | ORAL | Status: DC | PRN

## 2018-04-02 MED ORDER — SUGAMMADEX SODIUM 200 MG/2ML IV SOLN
INTRAVENOUS | Status: DC | PRN
  Administered 2018-04-02: 176 mg via INTRAVENOUS

## 2018-04-02 MED ORDER — ALVIMOPAN 12 MG PO CAPS
12.0000 mg | ORAL_CAPSULE | ORAL | Status: AC
  Administered 2018-04-02: 12 mg via ORAL
  Filled 2018-04-02: qty 1

## 2018-04-02 MED ORDER — MIDAZOLAM HCL 5 MG/5ML IJ SOLN
INTRAMUSCULAR | Status: DC | PRN
  Administered 2018-04-02: 2 mg via INTRAVENOUS

## 2018-04-02 MED ORDER — ACETAMINOPHEN 650 MG RE SUPP: 650.0000 mg | Freq: Four times a day (QID) | RECTAL | Status: DC | PRN

## 2018-04-02 MED ORDER — ONDANSETRON 4 MG PO TBDP: 4.0000 mg | ORAL_TABLET | Freq: Four times a day (QID) | ORAL | Status: DC | PRN

## 2018-04-02 MED ORDER — ENOXAPARIN SODIUM 40 MG/0.4ML ~~LOC~~ SOLN
40.0000 mg | SUBCUTANEOUS | Status: DC
  Administered 2018-04-03 – 2018-04-06 (×4): 40 mg via SUBCUTANEOUS
  Filled 2018-04-02 (×4): qty 0.4

## 2018-04-02 MED ORDER — GLYCOPYRROLATE 0.2 MG/ML IJ SOLN
INTRAMUSCULAR | Status: AC
  Filled 2018-04-02: qty 1

## 2018-04-02 MED ORDER — 0.9 % SODIUM CHLORIDE (POUR BTL) OPTIME
TOPICAL | Status: DC | PRN
  Administered 2018-04-02: 1000 mL
  Administered 2018-04-02: 2000 mL

## 2018-04-02 MED ORDER — POVIDONE-IODINE 10 % EX OINT
TOPICAL_OINTMENT | CUTANEOUS | Status: AC
  Filled 2018-04-02: qty 1

## 2018-04-02 MED ORDER — SEVOFLURANE IN SOLN
RESPIRATORY_TRACT | Status: AC
  Filled 2018-04-02: qty 250

## 2018-04-02 MED ORDER — PROPOFOL 10 MG/ML IV BOLUS
INTRAVENOUS | Status: DC | PRN
  Administered 2018-04-02: 150 mg via INTRAVENOUS

## 2018-04-02 MED ORDER — AMLODIPINE BESYLATE 5 MG PO TABS
5.0000 mg | ORAL_TABLET | Freq: Every day | ORAL | Status: DC
  Administered 2018-04-03 – 2018-04-06 (×4): 5 mg via ORAL
  Filled 2018-04-02 (×4): qty 1

## 2018-04-02 MED ORDER — ONDANSETRON HCL 4 MG/2ML IJ SOLN: 4.0000 mg | Freq: Four times a day (QID) | INTRAMUSCULAR | Status: DC | PRN

## 2018-04-02 MED ORDER — ROCURONIUM BROMIDE 50 MG/5ML IV SOSY
PREFILLED_SYRINGE | INTRAVENOUS | Status: DC | PRN
  Administered 2018-04-02: 40 mg via INTRAVENOUS

## 2018-04-02 MED ORDER — GLYCOPYRROLATE PF 0.2 MG/ML IJ SOSY
PREFILLED_SYRINGE | INTRAMUSCULAR | Status: DC | PRN
  Administered 2018-04-02: .2 mg via INTRAVENOUS

## 2018-04-02 MED ORDER — HYDROMORPHONE HCL 1 MG/ML IJ SOLN
1.0000 mg | INTRAMUSCULAR | Status: DC | PRN
  Administered 2018-04-02 – 2018-04-05 (×12): 1 mg via INTRAVENOUS
  Filled 2018-04-02 (×12): qty 1

## 2018-04-02 SURGICAL SUPPLY — 41 items
COVER LIGHT HANDLE STERIS (MISCELLANEOUS) ×4 IMPLANT
DRSG OPSITE POSTOP 4X10 (GAUZE/BANDAGES/DRESSINGS) ×1 IMPLANT
DRSG OPSITE POSTOP 4X8 (GAUZE/BANDAGES/DRESSINGS) IMPLANT
DRSG TEGADERM 4X4.75 (GAUZE/BANDAGES/DRESSINGS) ×1 IMPLANT
ELECT REM PT RETURN 9FT ADLT (ELECTROSURGICAL) ×2
ELECTRODE REM PT RTRN 9FT ADLT (ELECTROSURGICAL) ×1 IMPLANT
GAUZE XEROFORM 5X9 LF (GAUZE/BANDAGES/DRESSINGS) ×1 IMPLANT
GLOVE BIOGEL PI IND STRL 7.0 (GLOVE) ×5 IMPLANT
GLOVE BIOGEL PI INDICATOR 7.0 (GLOVE) ×7
GLOVE SURG SS PI 7.5 STRL IVOR (GLOVE) ×5 IMPLANT
GOWN STRL REUS W/TWL LRG LVL3 (GOWN DISPOSABLE) ×11 IMPLANT
INST SET MAJOR GENERAL (KITS) ×2 IMPLANT
KIT TURNOVER KIT A (KITS) ×2 IMPLANT
LIGASURE IMPACT 36 18CM CVD LR (INSTRUMENTS) ×2 IMPLANT
MANIFOLD NEPTUNE II (INSTRUMENTS) ×2 IMPLANT
NDL HYPO 18GX1.5 BLUNT FILL (NEEDLE) ×1 IMPLANT
NEEDLE HYPO 18GX1.5 BLUNT FILL (NEEDLE) ×2 IMPLANT
NS IRRIG 1000ML POUR BTL (IV SOLUTION) ×4 IMPLANT
PACK COLON (CUSTOM PROCEDURE TRAY) ×2 IMPLANT
PAD ARMBOARD 7.5X6 YLW CONV (MISCELLANEOUS) ×2 IMPLANT
PENCIL HANDSWITCHING (ELECTRODE) ×2 IMPLANT
RETRACTOR WND ALEXIS 25 LRG (MISCELLANEOUS) IMPLANT
RTRCTR WOUND ALEXIS 25CM LRG (MISCELLANEOUS) ×2
SPONGE LAP 18X18 RF (DISPOSABLE) ×2 IMPLANT
SPONGE LAP 18X18 X RAY DECT (DISPOSABLE) ×4 IMPLANT
STAPLER CUT CVD 40MM GREEN (STAPLE) ×1 IMPLANT
STAPLER PROXIMATE 75MM BLUE (STAPLE) ×1 IMPLANT
STAPLER RELOADABLE 65 2-0 SUT (MISCELLANEOUS) IMPLANT
STAPLER SYS INTERNAL RELOAD SS (MISCELLANEOUS) ×2 IMPLANT
STAPLER TA28 THK CONTR EEA XL (STAPLE) ×1 IMPLANT
STAPLER VISISTAT (STAPLE) ×2 IMPLANT
SUT CHROMIC 0 SH (SUTURE) IMPLANT
SUT CHROMIC 2 0 SH (SUTURE) IMPLANT
SUT CHROMIC 3 0 SH 27 (SUTURE) IMPLANT
SUT NOVA NAB GS-26 0 60 (SUTURE) IMPLANT
SUT PDS AB 0 CTX 60 (SUTURE) IMPLANT
SUT SILK 2 0 (SUTURE)
SUT SILK 2-0 18XBRD TIE 12 (SUTURE) IMPLANT
SUT SILK 3 0 SH CR/8 (SUTURE) ×2 IMPLANT
TRAY FOLEY MTR SLVR 16FR STAT (SET/KITS/TRAYS/PACK) ×2 IMPLANT
YANKAUER SUCT BULB TIP 10FT TU (MISCELLANEOUS) ×2 IMPLANT

## 2018-04-02 NOTE — Interval H&P Note (Signed)
History and Physical Interval Note:  04/02/2018 9:59 AM  Tim Williams  has presented today for surgery, with the diagnosis of rectal mass  The various methods of treatment have been discussed with the patient and family. After consideration of risks, benefits and other options for treatment, the patient has consented to  Procedure(s): PARTIAL COLECTOMY LOWER ANTERIOR RESECTIONS (N/A) as a surgical intervention .  The patient's history has been reviewed, patient examined, no change in status, stable for surgery.  I have reviewed the patient's chart and labs.  Questions were answered to the patient's satisfaction.     Aviva Signs  Also will biopsy nonhealing wound left arm.

## 2018-04-02 NOTE — Transfer of Care (Signed)
Immediate Anesthesia Transfer of Care Note  Patient: Tim Williams  Procedure(s) Performed: PARTIAL COLECTOMY LOWER ANTERIOR RESECTIONS and biopsy of left arm wound (N/A )  Patient Location: PACU  Anesthesia Type:General  Level of Consciousness: awake, oriented and patient cooperative  Airway & Oxygen Therapy: Patient Spontanous Breathing and Patient connected to nasal cannula oxygen  Post-op Assessment: Report given to RN and Post -op Vital signs reviewed and stable  Post vital signs: Reviewed and stable  Last Vitals:  Vitals Value Taken Time  BP 153/81 04/02/2018 12:30 PM  Temp    Pulse 31 04/02/2018 12:31 PM  Resp 13 04/02/2018 12:31 PM  SpO2 88 % 04/02/2018 12:31 PM  Vitals shown include unvalidated device data.  Last Pain:  Vitals:   04/02/18 0910  TempSrc: Oral  PainSc: 0-No pain      Patients Stated Pain Goal: 5 (79/02/40 9735)  Complications: No apparent anesthesia complications

## 2018-04-02 NOTE — Anesthesia Postprocedure Evaluation (Signed)
Anesthesia Post Note  Patient: Tim Williams  Procedure(s) Performed: PARTIAL COLECTOMY LOWER ANTERIOR RESECTIONS and biopsy of left arm wound (N/A )  Patient location during evaluation: PACU Anesthesia Type: General Level of consciousness: awake and alert and patient cooperative Pain management: satisfactory to patient Vital Signs Assessment: post-procedure vital signs reviewed and stable Respiratory status: spontaneous breathing and patient connected to nasal cannula oxygen Cardiovascular status: stable Postop Assessment: no apparent nausea or vomiting Anesthetic complications: no Comments: Awaiting room assignment.     Last Vitals:  Vitals:   04/02/18 1300 04/02/18 1315  BP: 135/75 120/75  Pulse: 60 61  Resp: 19 12  Temp:    SpO2: 100% 97%    Last Pain:  Vitals:   04/02/18 1315  TempSrc:   PainSc: Asleep                 Ellenora Talton

## 2018-04-02 NOTE — Op Note (Signed)
Patient:  Tim Williams  DOB:  Jun 14, 1950  MRN:  [id number]   Preop Diagnosis: Tubular adenoma of rectum, nonhealing wound of left arm  Postop Diagnosis: Same  Procedure: Low anterior resection with transanal anastomosis, skin biopsy of left arm wound  Surgeon: Aviva Signs, MD  Assistant: Blake Divine, MD  Anes: General endotracheal  Indications: Patient is a 67 year old white male who was found on colonoscopy to have a very large tubular adenoma of the rectum as well as a nonhealing wound on his left arm.  The patient now presents to the operating room for a low anterior resection as well as biopsy of the left arm lesion.  Risks and benefits of both procedures including bleeding, infection, cardiopulmonary difficulties, anastomotic leak, the possibility of a cancer, and the possibility of needing a colostomy were fully explained to the patient, who gave informed consent.  Procedure note: The patient was placed in the lithotomy position after induction of general endotracheal anesthesia.  The perineum was prepped and draped using Betadine.  The abdomen was prepped and draped using usual sterile technique with ChloraPrep.  Surgical site confirmation was performed.  A midline incision was made from the umbilicus to the suprapubic region.  The peritoneal cavity was entered into without difficulty.  The liver was palpated and noted within normal limits.  No abnormal lesions were noted.  An attachment of omentum to the umbilicus was released using Bovie electrocautery.  The patient did have an umbilical hernia which was addressed at the end of the procedure.  The sigmoid colon was noted to be tortuous and was mobilized along its peritoneal reflection.  Care was taken to avoid the left ureter.  The dissection was taken down to the rectum and peritoneal reflection.  This was incised anteriorly.  I was able to palpate the large mass.  I was able to get normal rectum distal to the mass once the  peritoneal reflection was incised.  A 75 GIA stapler was placed across the distal sigmoid colon and fired.  A contour TA 60 was placed distal to the mass across the rectum and fired.  This was above the peritoneal reflection.  The mesentery was then divided using the LigaSure.  The specimen was then removed from the operative field.  It was opened intraoperatively and a good distal margin of greater than 2 cm was found.  An auto pursestring her was then used on the sigmoid colon and the top portion of the #28 circular EEA stapler was placed.  Transanally, the base of the EEA stapler was then advanced to the rectal stump.  It was opened and the 2 ends were snapped together.  It was then fired once it was closed.  The EEA stapler was then fired and removed.  The donuts were intact and sent to pathology for the examination.  There was then instilled transanally to test the anastomosis and no leak was found.  3-0 silk sutures were then placed anteriorly to bring the peritoneum over the anastomosis.  The pelvis was then copiously irrigated with normal saline.  All fluid was then evacuated from the abdominal cavity.  A tape and needle count was then done and it was correct.  All operating personnel then changed her gown and gloves.  A new set-up was used for closure.  The umbilical hernia was repaired using internal 0 Ethibond interrupted sutures.  The fascia was reapproximated using a looped 0 PDS running suture.  The subcutaneous layer was irrigated with normal saline.  Exparel was instilled into the surrounding wound.  The skin was closed using staples.  Betadine ointment and dry sterile dressing were applied.  All tape and needle counts were correct at the end of this procedure.  I next did a skin biopsy of the left arm wound.  This was prepped with Betadine.  A full-thickness skin biopsy was performed and sent to pathology for further examination.  A bleeding was controlled using Bovie electrocautery.  Xeroform  and a dry sterile dressing were then applied.  All tape and needle counts were correct at the end of the procedure.  Patient was extubated in the operating room and transferred to PACU in stable condition.  Complications: None  EBL: 150 cc  Specimen: Rectum, skin biopsy of left arm wound

## 2018-04-02 NOTE — Anesthesia Postprocedure Evaluation (Signed)
Anesthesia Post Note  Patient: Tim Williams  Procedure(s) Performed: PARTIAL COLECTOMY LOWER ANTERIOR RESECTIONS and biopsy of left arm wound (N/A )  Patient location during evaluation: PACU Anesthesia Type: General Level of consciousness: awake and alert and oriented Pain management: pain level controlled Vital Signs Assessment: post-procedure vital signs reviewed and stable Respiratory status: spontaneous breathing Cardiovascular status: stable Postop Assessment: no apparent nausea or vomiting Anesthetic complications: no     Last Vitals:  Vitals:   04/02/18 0940 04/02/18 1230  BP: 114/71   Pulse:  73  Resp: 20   Temp:  36.6 C  SpO2: 94% 96%    Last Pain:  Vitals:   04/02/18 0910  TempSrc: Oral  PainSc: 0-No pain                 ADAMS, AMY A

## 2018-04-02 NOTE — Anesthesia Preprocedure Evaluation (Signed)
Anesthesia Evaluation  Patient identified by MRN, date of birth, ID band Patient awake    Reviewed: Allergy & Precautions, NPO status , Patient's Chart, lab work & pertinent test results  Airway Mallampati: II  TM Distance: >3 FB Neck ROM: Full    Dental no notable dental hx. (+) Poor Dentition, Missing   Pulmonary shortness of breath and with exertion, COPD, Current Smoker,  Reports smoking 1.5 PPD for over 40 years  Denies current meds  Denies oxygen use  Reports only SOB going [place]  Smoked today   Pulmonary exam normal breath sounds clear to auscultation + decreased breath sounds      Cardiovascular Exercise Tolerance: Poor hypertension, Pt. on medications negative cardio ROS Normal cardiovascular examII Rhythm:Regular Rate:Normal  Denies any CP/MI/cardiac interventions  States s/p TKA in past which limits mobility  Denies NTG use   Neuro/Psych negative neurological ROS  negative psych ROS   GI/Hepatic Neg liver ROS, PUD, Known hard  rectal mass here for resection, pt states unaware if it is cancer    Endo/Other  negative endocrine ROSdiabetes, Well Controlled, Type 2  Renal/GU negative Renal ROS  negative genitourinary   Musculoskeletal  (+) Arthritis , Osteoarthritis,  Has a known open lesion on his proximal LUE, Dr. Arnoldo Morale aware. States originally seen ~2013 for this with Derm. Probable Bx by Dr. Arnoldo Morale today as well    Abdominal   Peds negative pediatric ROS (+)  Hematology negative hematology ROS (+)   Anesthesia Other Findings   Reproductive/Obstetrics negative OB ROS                             Anesthesia Physical Anesthesia Plan  ASA: III  Anesthesia Plan: General   Post-op Pain Management:    Induction: Intravenous  PONV Risk Score and Plan:   Airway Management Planned: Oral ETT  Additional Equipment:   Intra-op Plan:   Post-operative Plan: Extubation  in OR  Informed Consent: I have reviewed the patients History and Physical, chart, labs and discussed the procedure including the risks, benefits and alternatives for the proposed anesthesia with the patient or authorized representative who has indicated his/her understanding and acceptance.   Dental advisory given  Plan Discussed with: CRNA  Anesthesia Plan Comments:         Anesthesia Quick Evaluation

## 2018-04-02 NOTE — Anesthesia Procedure Notes (Signed)
Procedure Name: Intubation Date/Time: 04/02/2018 10:11 AM Performed by: Andree Elk, Amy A, CRNA Pre-anesthesia Checklist: Patient identified, Patient being monitored, Timeout performed, Emergency Drugs available and Suction available Patient Re-evaluated:Patient Re-evaluated prior to induction Oxygen Delivery Method: Circle system utilized Preoxygenation: Pre-oxygenation with 100% oxygen Induction Type: IV induction Laryngoscope Size: 3 and Miller Grade View: Grade I Tube type: Oral Tube size: 7.0 mm Number of attempts: 1 Airway Equipment and Method: Stylet Placement Confirmation: ETT inserted through vocal cords under direct vision,  positive ETCO2 and breath sounds checked- equal and bilateral Secured at: 22 cm Tube secured with: Tape Dental Injury: Teeth and Oropharynx as per pre-operative assessment

## 2018-04-02 NOTE — Addendum Note (Signed)
Addendum  created 04/02/18 1339 by Vista Deck, CRNA   Sign clinical note

## 2018-04-02 NOTE — Plan of Care (Signed)
Pt arrived to floor. Bedside shift report completed upon arrival. Pt denies pain. Bloody drainage noted on surgical dressing midline honeycomb dressing and drainage from biopsy gauze. Patient is on 2 L nasal cannula and his vitals are stable. Foley catheter in place and SCDs are on. He denies pain. Sons Gerald Stabs and [place] are at the bedside.

## 2018-04-03 ENCOUNTER — Encounter (HOSPITAL_COMMUNITY): Payer: Self-pay | Admitting: General Surgery

## 2018-04-03 LAB — CBC
HCT: 39.4 % (ref 39.0–52.0)
Hemoglobin: 12.9 g/dL — ABNORMAL LOW (ref 13.0–17.0)
MCH: 32.7 pg (ref 26.0–34.0)
MCHC: 32.7 g/dL (ref 30.0–36.0)
MCV: 99.7 fL (ref 80.0–100.0)
NRBC: 0 % (ref 0.0–0.2)
PLATELETS: 218 10*3/uL (ref 150–400)
RBC: 3.95 MIL/uL — AB (ref 4.22–5.81)
RDW: 12.4 % (ref 11.5–15.5)
WBC: 13.1 10*3/uL — ABNORMAL HIGH (ref 4.0–10.5)

## 2018-04-03 LAB — MAGNESIUM: Magnesium: 2 mg/dL (ref 1.7–2.4)

## 2018-04-03 LAB — GLUCOSE, CAPILLARY
GLUCOSE-CAPILLARY: 122 mg/dL — AB (ref 70–99)
Glucose-Capillary: 95 mg/dL (ref 70–99)
Glucose-Capillary: 97 mg/dL (ref 70–99)

## 2018-04-03 LAB — BASIC METABOLIC PANEL
ANION GAP: 7 (ref 5–15)
BUN: 15 mg/dL (ref 8–23)
CHLORIDE: 106 mmol/L (ref 98–111)
CO2: 22 mmol/L (ref 22–32)
Calcium: 7.9 mg/dL — ABNORMAL LOW (ref 8.9–10.3)
Creatinine, Ser: 1.02 mg/dL (ref 0.61–1.24)
GFR calc non Af Amer: >60 mL/min (ref >60)
Glucose, Bld: 168 mg/dL — ABNORMAL HIGH (ref 70–99)
POTASSIUM: 2.7 mmol/L — AB (ref 3.5–5.1)
SODIUM: 135 mmol/L (ref 135–145)

## 2018-04-03 LAB — PHOSPHORUS: PHOSPHORUS: 2.6 mg/dL (ref 2.5–4.6)

## 2018-04-03 MED ORDER — POTASSIUM CHLORIDE 10 MEQ/100ML IV SOLN
10.0000 meq | INTRAVENOUS | Status: AC
  Administered 2018-04-03 (×5): 10 meq via INTRAVENOUS
  Filled 2018-04-03 (×4): qty 100

## 2018-04-03 MED ORDER — MENTHOL 3 MG MT LOZG
1.0000 | LOZENGE | OROMUCOSAL | Status: DC | PRN
  Administered 2018-04-04: 3 mg via ORAL
  Filled 2018-04-03: qty 9

## 2018-04-03 MED ORDER — KCL IN DEXTROSE-NACL 20-5-0.45 MEQ/L-%-% IV SOLN
INTRAVENOUS | Status: DC
  Administered 2018-04-03 – 2018-04-04 (×3): via INTRAVENOUS

## 2018-04-03 MED ORDER — ALBUTEROL SULFATE (2.5 MG/3ML) 0.083% IN NEBU: 2.5000 mg | INHALATION_SOLUTION | RESPIRATORY_TRACT | Status: DC | PRN

## 2018-04-03 NOTE — Progress Notes (Signed)
CRITICAL VALUE ALERT  Critical Value: Potassium 2.7  Date & Time Notied:  04/03/2018, 0806  Provider Notified: Dr. Arnoldo Morale  Orders Received/Actions taken: Received orders to administer Potassium 10 mEq in 100 ml IVPB X 5

## 2018-04-03 NOTE — Progress Notes (Signed)
1 Day Post-Op  Subjective: Patient has moderate incisional pain.  Does complain of a lump in his throat.  Objective: Vital signs in last 24 hours: Temp:  [96.8 F (36 C)-99 F (37.2 C)] 99 F (37.2 C) (10/22 0701) Pulse Rate:  [57-80] 76 (10/22 0701) Resp:  [12-21] 15 (10/22 0701) BP: (100-153)/(53-91) 103/66 (10/22 0701) SpO2:  [90 %-100 %] 91 % (10/22 0701) Weight:  [88 kg] 88 kg (10/21 0910) Last BM Date: 04/02/18  Intake/Output from previous day: 10/21 0701 - 10/22 0700 In: 4761.3 [P.O.:720; I.V.:4041.3] Out: 750 [Urine:600; Blood:150] Intake/Output this shift: No intake/output data recorded.  General appearance: alert, cooperative and no distress Resp: Bilateral expiratory mild wheezing noted.  No rales present. Cardio: regular rate and rhythm, S1, S2 normal, no murmur, click, rub or gallop GI: Soft, incision healing well.  Occasional bowel sounds appreciated.  Lab Results:  Recent Labs    04/03/18 0615  WBC 13.1*  HGB 12.9*  HCT 39.4  PLT 218   BMET Recent Labs    04/03/18 0615  NA 135  K 2.7*  CL 106  CO2 22  GLUCOSE 168*  BUN 15  CREATININE 1.02  CALCIUM 7.9*   PT/INR No results for input(s): LABPROT, INR in the last 72 hours.  Studies/Results: No results found.  Anti-infectives: Anti-infectives (From admission, onward)   Start     Dose/Rate Route Frequency Ordered Stop   04/02/18 0845  ciprofloxacin (CIPRO) IVPB 400 mg     400 mg 200 mL/hr over 60 Minutes Intravenous On call to O.R. 04/02/18 0844 04/02/18 1005   04/02/18 0845  metroNIDAZOLE (FLAGYL) IVPB 500 mg     500 mg 100 mL/hr over 60 Minutes Intravenous On call to O.R. 04/02/18 0844 04/02/18 1034      Assessment/Plan: s/p Procedure(s): PARTIAL COLECTOMY LOWER ANTERIOR RESECTIONS and biopsy of left arm wound Impression: Stable on postoperative day 1.  Patient does have hypokalemia which will be addressed.  Patient has mild wheezing secondary to COPD and smoking.  Nebulizer  treatments have been ordered.  We will get patient up in bed.  Awaiting return of bowel function.  LOS: 1 day    Aviva Signs 04/03/2018

## 2018-04-04 LAB — BASIC METABOLIC PANEL
Anion gap: 5 (ref 5–15)
BUN: 10 mg/dL (ref 8–23)
CO2: 25 mmol/L (ref 22–32)
CREATININE: 0.8 mg/dL (ref 0.61–1.24)
Calcium: 8.1 mg/dL — ABNORMAL LOW (ref 8.9–10.3)
Chloride: 104 mmol/L (ref 98–111)
GFR calc Af Amer: >60 mL/min (ref >60)
GLUCOSE: 125 mg/dL — AB (ref 70–99)
Potassium: 3.2 mmol/L — ABNORMAL LOW (ref 3.5–5.1)
SODIUM: 134 mmol/L — AB (ref 135–145)

## 2018-04-04 LAB — GLUCOSE, CAPILLARY
GLUCOSE-CAPILLARY: 123 mg/dL — AB (ref 70–99)
GLUCOSE-CAPILLARY: 104 mg/dL — AB (ref 70–99)
Glucose-Capillary: 106 mg/dL — ABNORMAL HIGH (ref 70–99)
Glucose-Capillary: 65 mg/dL — ABNORMAL LOW (ref 70–99)
Glucose-Capillary: 89 mg/dL (ref 70–99)
Glucose-Capillary: 99 mg/dL (ref 70–99)

## 2018-04-04 LAB — CBC
HCT: 37.4 % — ABNORMAL LOW (ref 39.0–52.0)
Hemoglobin: 12 g/dL — ABNORMAL LOW (ref 13.0–17.0)
MCH: 31.8 pg (ref 26.0–34.0)
MCHC: 32.1 g/dL (ref 30.0–36.0)
MCV: 99.2 fL (ref 80.0–100.0)
Platelets: 198 10*3/uL (ref 150–400)
RBC: 3.77 MIL/uL — ABNORMAL LOW (ref 4.22–5.81)
RDW: 12.5 % (ref 11.5–15.5)
WBC: 12.8 10*3/uL — ABNORMAL HIGH (ref 4.0–10.5)
nRBC: 0 % (ref 0.0–0.2)

## 2018-04-04 LAB — HEMOGLOBIN A1C
HEMOGLOBIN A1C: 6.7 % — AB (ref 4.8–5.6)
Mean Plasma Glucose: 146 mg/dL

## 2018-04-04 LAB — PHOSPHORUS: Phosphorus: 1.5 mg/dL — ABNORMAL LOW (ref 2.5–4.6)

## 2018-04-04 LAB — MAGNESIUM: MAGNESIUM: 2.1 mg/dL (ref 1.7–2.4)

## 2018-04-04 MED ORDER — KCL IN DEXTROSE-NACL 20-5-0.45 MEQ/L-%-% IV SOLN
INTRAVENOUS | Status: DC
  Administered 2018-04-04 (×2): via INTRAVENOUS

## 2018-04-04 MED ORDER — POTASSIUM PHOSPHATES 15 MMOLE/5ML IV SOLN
20.0000 mmol | Freq: Once | INTRAVENOUS | Status: AC
  Administered 2018-04-04: 20 mmol via INTRAVENOUS
  Filled 2018-04-04: qty 6.67

## 2018-04-04 NOTE — Progress Notes (Signed)
Patient reports he had "small bowel movement". Not witnessed by nursing staff. Ambulated in room with walker and standby assist. Tolerated well. Donavan Foil, RN

## 2018-04-04 NOTE — Progress Notes (Signed)
Patient tolerating full liquids with no complaints. Denies nausea, no vomiting. C/o incisional pain "with movement". States "pain better after pain medication but comes back when it wears off." bowel sounds present, denies flatus or stool today. Ambulates in room with assistance and walker and has been up to chair twice today. Tolerated well. Donavan Foil, RN

## 2018-04-04 NOTE — Progress Notes (Signed)
2 Days Post-Op  Subjective: Patient having moderate incisional pain.  Did have to be straight cathed once yesterday.  He has since voided on his own.  No bowel movement or flatus yet.  Objective: Vital signs in last 24 hours: Temp:  [97.6 F (36.4 C)-98.6 F (37 C)] 98.1 F (36.7 C) (10/23 0711) Pulse Rate:  [77-80] 77 (10/23 0711) Resp:  [18-21] 18 (10/23 0711) BP: (105-115)/(62-76) 105/62 (10/23 0711) SpO2:  [89 %-93 %] 93 % (10/23 0711) Last BM Date: 04/02/18  Intake/Output from previous day: 10/22 0701 - 10/23 0700 In: 2683.2 [P.O.:1730; I.V.:579.4; IV Piggyback:373.7] Out: 750 [Urine:750] Intake/Output this shift: Total I/O In: 1303.8 [I.V.:1303.8] Out: -   General appearance: alert, cooperative and no distress Resp: clear to auscultation bilaterally Cardio: regular rate and rhythm, S1, S2 normal, no murmur, click, rub or gallop GI: Soft, incision healing well.  Occasional bowel sounds appreciated.  Lab Results:  Recent Labs    04/03/18 0615 04/04/18 0615  WBC 13.1* 12.8*  HGB 12.9* 12.0*  HCT 39.4 37.4*  PLT 218 198   BMET Recent Labs    04/03/18 0615 04/04/18 0615  NA 135 134*  K 2.7* 3.2*  CL 106 104  CO2 22 25  GLUCOSE 168* 125*  BUN 15 10  CREATININE 1.02 0.80  CALCIUM 7.9* 8.1*   PT/INR No results for input(s): LABPROT, INR in the last 72 hours.  Studies/Results: No results found.  Anti-infectives: Anti-infectives (From admission, onward)   Start     Dose/Rate Route Frequency Ordered Stop   04/02/18 0845  ciprofloxacin (CIPRO) IVPB 400 mg     400 mg 200 mL/hr over 60 Minutes Intravenous On call to O.R. 04/02/18 0844 04/02/18 1005   04/02/18 0845  metroNIDAZOLE (FLAGYL) IVPB 500 mg     500 mg 100 mL/hr over 60 Minutes Intravenous On call to O.R. 04/02/18 0844 04/02/18 1034      Assessment/Plan: s/p Procedure(s): PARTIAL COLECTOMY LOWER ANTERIOR RESECTIONS and biopsy of left arm wound Impression: Stable on postoperative day 2.   Awaiting return of bowel function.  Patient does have hypophosphatemia which will be addressed.  His hypokalemia is slowly resolving.  Will advance to full liquid diet.  Awaiting final pathology.  LOS: 2 days    Aviva Signs 04/04/2018

## 2018-04-04 NOTE — Care Management Important Message (Signed)
Important Message  Patient Details  Name: Tim Williams MRN: [id number] Date of Birth: 1950-09-18   Medicare Important Message Given:  Yes    Shelda Altes 04/04/2018, 1:27 PM

## 2018-04-04 NOTE — Progress Notes (Signed)
Patient c/o lower abdominal pain.  Completed bladder scan and no urine was showing during scan.  Patient has voided small amounts several times during night.

## 2018-04-04 NOTE — Progress Notes (Signed)
MEDICATION RELATED CONSULT NOTE - INITIAL   Pharmacy Consult for hypophosphotemia    Allergies  Allergen Reactions  . Ibuprofen     Pt can't take due to ulcers.  . Penicillins Other (See Comments)    "knocks out" Childhood allergy Has patient had a PCN reaction causing immediate rash, facial/tongue/throat swelling, SOB or lightheadedness with hypotension: Unknown Has patient had a PCN reaction causing severe rash involving mucus membranes or skin necrosis: Unknown Has patient had a PCN reaction that required hospitalization: No Has patient had a PCN reaction occurring within the last 10 years: No If all of the above answers are "NO", then may proceed with Cephalosporin use.      Patient Measurements: Height: 5\' 8"  (172.7 cm) Weight: 194 lb (88 kg) IBW/kg (Calculated) : 68.4   Vital Signs: Temp: 98.1 F (36.7 C) (10/23 0711) Temp Source: Oral (10/23 0711) BP: 105/62 (10/23 0711) Pulse Rate: 77 (10/23 0711) Intake/Output from previous day: 10/22 0701 - 10/23 0700 In: 2683.2 [P.O.:1730; I.V.:579.4; IV Piggyback:373.7] Out: 750 [Urine:750] Intake/Output from this shift: Total I/O In: 1303.8 [I.V.:1303.8] Out: -   Labs: Recent Labs    04/03/18 0615 04/04/18 0615  WBC 13.1* 12.8*  HGB 12.9* 12.0*  HCT 39.4 37.4*  PLT 218 198  CREATININE 1.02 0.80  MG 2.0 2.1  PHOS 2.6 1.5*   Estimated Creatinine Clearance: 99.2 mL/min (by C-G formula based on SCr of 0.8 mg/dL).   Microbiology: No results found for this or any previous visit (from the past 720 hour(s)).  Medical History: Past Medical History:  Diagnosis Date  . Arthritis   . COPD (chronic obstructive pulmonary disease) ([place])   . Diabetes ([place])   . Gastric ulcer    2009  . Hypertension   . Shortness of breath   . Wound healing, delayed n   left upper arm     Assessment: Pharmacy consulted to dose patient with hypophosphotemia (1.5)  Plan:  Potassium phosphate 20 mmol x 1 dose. Monitor phosphorus  level in AM.  Tim Williams 04/04/2018,9:05 AM

## 2018-04-04 NOTE — Progress Notes (Signed)
Hypoglycemic Event  CBG: 65  Treatment: 15 GM carbohydrate snack  Symptoms: None  Follow-up CBG: Time:1220 CBG Result:104  Possible Reasons for Event: Unknown  Comments/MD notified: Patient had orange juice and pudding prior to lunch. Rechecked CBG was 104. Patient remains asymptomatic with hypoglycemia. Dr Arnoldo Morale in surgery, notified nursing staff who answered. Stated they will pass along the message. Donavan Foil, RN

## 2018-04-05 LAB — PHOSPHORUS: PHOSPHORUS: 2.2 mg/dL — AB (ref 2.5–4.6)

## 2018-04-05 LAB — BASIC METABOLIC PANEL
ANION GAP: 7 (ref 5–15)
BUN: 8 mg/dL (ref 8–23)
CALCIUM: 8.3 mg/dL — AB (ref 8.9–10.3)
CO2: 26 mmol/L (ref 22–32)
CREATININE: 0.75 mg/dL (ref 0.61–1.24)
Chloride: 103 mmol/L (ref 98–111)
Glucose, Bld: 104 mg/dL — ABNORMAL HIGH (ref 70–99)
Potassium: 3.4 mmol/L — ABNORMAL LOW (ref 3.5–5.1)
SODIUM: 136 mmol/L (ref 135–145)

## 2018-04-05 LAB — GLUCOSE, CAPILLARY
GLUCOSE-CAPILLARY: 107 mg/dL — AB (ref 70–99)
GLUCOSE-CAPILLARY: 101 mg/dL — AB (ref 70–99)
GLUCOSE-CAPILLARY: 82 mg/dL (ref 70–99)
Glucose-Capillary: 117 mg/dL — ABNORMAL HIGH (ref 70–99)

## 2018-04-05 LAB — CBC
HCT: 36.4 % — ABNORMAL LOW (ref 39.0–52.0)
Hemoglobin: 12 g/dL — ABNORMAL LOW (ref 13.0–17.0)
MCH: 33 pg (ref 26.0–34.0)
MCHC: 33 g/dL (ref 30.0–36.0)
MCV: 100 fL (ref 80.0–100.0)
NRBC: 0 % (ref 0.0–0.2)
PLATELETS: 212 10*3/uL (ref 150–400)
RBC: 3.64 MIL/uL — ABNORMAL LOW (ref 4.22–5.81)
RDW: 12.3 % (ref 11.5–15.5)
WBC: 11 10*3/uL — AB (ref 4.0–10.5)

## 2018-04-05 LAB — MAGNESIUM: MAGNESIUM: 2.1 mg/dL (ref 1.7–2.4)

## 2018-04-05 MED ORDER — POTASSIUM PHOSPHATES 15 MMOLE/5ML IV SOLN
20.0000 mmol | Freq: Once | INTRAVENOUS | Status: AC
  Administered 2018-04-05: 20 mmol via INTRAVENOUS
  Filled 2018-04-05: qty 6.67

## 2018-04-05 NOTE — Progress Notes (Signed)
3 Days Post-Op  Subjective: Patient does complain of some burning sensation when voiding.  Has had multiple small bowel movements.  Is hungry.  No nausea or vomiting have been noted.  Incisional pain seems to be under control.  Objective: Vital signs in last 24 hours: Temp:  [97.8 F (36.6 C)-98.7 F (37.1 C)] 97.8 F (36.6 C) (10/24 0607) Pulse Rate:  [71-85] 85 (10/24 0607) Resp:  [16-20] 20 (10/24 0607) BP: (116-127)/(63-77) 118/63 (10/24 0607) SpO2:  [90 %-98 %] 98 % (10/24 0607) Last BM Date: 04/02/18  Intake/Output from previous day: 10/23 0701 - 10/24 0700 In: 2696.1 [P.O.:240; I.V.:1947.9; IV Piggyback:508.2] Out: 1200 [Urine:1200] Intake/Output this shift: No intake/output data recorded.  General appearance: alert, cooperative and no distress Resp: clear to auscultation bilaterally Cardio: regular rate and rhythm, S1, S2 normal, no murmur, click, rub or gallop GI: Soft, incision healing well.  Bowel sounds active. Skin: Left arm wound with Xeroform in place.  Lab Results:  Recent Labs    04/04/18 0615 04/05/18 0504  WBC 12.8* 11.0*  HGB 12.0* 12.0*  HCT 37.4* 36.4*  PLT 198 212   BMET Recent Labs    04/04/18 0615 04/05/18 0504  NA 134* 136  K 3.2* 3.4*  CL 104 103  CO2 25 26  GLUCOSE 125* 104*  BUN 10 8  CREATININE 0.80 0.75  CALCIUM 8.1* 8.3*   PT/INR No results for input(s): LABPROT, INR in the last 72 hours.  Studies/Results: No results found.  Anti-infectives: Anti-infectives (From admission, onward)   Start     Dose/Rate Route Frequency Ordered Stop   04/02/18 0845  ciprofloxacin (CIPRO) IVPB 400 mg     400 mg 200 mL/hr over 60 Minutes Intravenous On call to O.R. 04/02/18 0844 04/02/18 1005   04/02/18 0845  metroNIDAZOLE (FLAGYL) IVPB 500 mg     500 mg 100 mL/hr over 60 Minutes Intravenous On call to O.R. 04/02/18 0844 04/02/18 1034      Assessment/Plan: s/p Procedure(s): PARTIAL COLECTOMY LOWER ANTERIOR RESECTIONS and biopsy of  left arm wound Impression: Bowel function starting to return.  Will advance diet.  Suspect dysuria secondary to multiple catheterizations.  No need for antibiotic at this time.  Will start ambulating.  Final pathology pending.  Anticipate discharge in next 24 to 48 hours.  LOS: 3 days    Aviva Signs 04/05/2018

## 2018-04-05 NOTE — Progress Notes (Signed)
MEDICATION RELATED CONSULT NOTE - follow up  Pharmacy Consult for hypophosphotemia    Allergies  Allergen Reactions  . Ibuprofen     Pt can't take due to ulcers.  . Penicillins Other (See Comments)    "knocks out" Childhood allergy Has patient had a PCN reaction causing immediate rash, facial/tongue/throat swelling, SOB or lightheadedness with hypotension: Unknown Has patient had a PCN reaction causing severe rash involving mucus membranes or skin necrosis: Unknown Has patient had a PCN reaction that required hospitalization: No Has patient had a PCN reaction occurring within the last 10 years: No If all of the above answers are "NO", then may proceed with Cephalosporin use.      Patient Measurements: Height: 5\' 8"  (172.7 cm) Weight: 194 lb (88 kg) IBW/kg (Calculated) : 68.4   Vital Signs: Temp: 98.1 F (36.7 C) (10/24 0900) Temp Source: Oral (10/24 0900) BP: 112/77 (10/24 0900) Pulse Rate: 77 (10/24 0900) Intake/Output from previous day: 10/23 0701 - 10/24 0700 In: 2696.1 [P.O.:240; I.V.:1947.9; IV Piggyback:508.2] Out: 1200 [Urine:1200] Intake/Output from this shift: No intake/output data recorded.  Labs: Recent Labs    04/03/18 0615 04/04/18 0615 04/05/18 0504  WBC 13.1* 12.8* 11.0*  HGB 12.9* 12.0* 12.0*  HCT 39.4 37.4* 36.4*  PLT 218 198 212  CREATININE 1.02 0.80 0.75  MG 2.0 2.1 2.1  PHOS 2.6 1.5* 2.2*   Estimated Creatinine Clearance: 99.2 mL/min (by C-G formula based on SCr of 0.75 mg/dL).   Microbiology: No results found for this or any previous visit (from the past 720 hour(s)).  Medical History: Past Medical History:  Diagnosis Date  . Arthritis   . COPD (chronic obstructive pulmonary disease) ([place])   . Diabetes ([place])   . Gastric ulcer    2009  . Hypertension   . Shortness of breath   . Wound healing, delayed n   left upper arm     Assessment: Pharmacy consulted to dose patient with hypophosphotemia (1.5). Phosphorus level today is  2.2(10/24), still not at goal. Bowel functioning starting to return. Will continue with IV replacement. Also, hypokalemic at 3.4.   Plan:  Potassium phosphate 20 mmol x 1 dose. Monitor phosphorus level in AM.  Isac Sarna, BS [place], BCPS Clinical Pharmacist Pager [phone] 04/05/2018,11:02 AM

## 2018-04-05 NOTE — Progress Notes (Signed)
Patient has been complaining of burning with urination. Patient states the burning has gotten worse during the night.

## 2018-04-06 LAB — BASIC METABOLIC PANEL
Anion gap: 7 (ref 5–15)
BUN: 10 mg/dL (ref 8–23)
CHLORIDE: 104 mmol/L (ref 98–111)
CO2: 26 mmol/L (ref 22–32)
Calcium: 8.2 mg/dL — ABNORMAL LOW (ref 8.9–10.3)
Creatinine, Ser: 0.77 mg/dL (ref 0.61–1.24)
GFR calc Af Amer: >60 mL/min (ref >60)
GFR calc non Af Amer: >60 mL/min (ref >60)
GLUCOSE: 123 mg/dL — AB (ref 70–99)
Potassium: 3.7 mmol/L (ref 3.5–5.1)
SODIUM: 137 mmol/L (ref 135–145)

## 2018-04-06 LAB — PHOSPHORUS: Phosphorus: 3.2 mg/dL (ref 2.5–4.6)

## 2018-04-06 LAB — GLUCOSE, CAPILLARY: Glucose-Capillary: 117 mg/dL — ABNORMAL HIGH (ref 70–99)

## 2018-04-06 MED ORDER — OXYCODONE-ACETAMINOPHEN 7.5-325 MG PO TABS: 1.0000 | ORAL_TABLET | Freq: Four times a day (QID) | ORAL | 0 refills | Status: DC | PRN

## 2018-04-06 NOTE — Discharge Summary (Signed)
Physician Discharge Summary  Patient ID: Tim Williams MRN: [id number] DOB/AGE: 08/01/1950 67 y.o.  Admit date: 04/02/2018 Discharge date: 04/06/2018  Admission Diagnoses: Rectal mass, nonhealing wound of left arm  Discharge Diagnoses: Villous adenoma of rectum, squamous cell carcinoma of left arm Active Problems:   Tubular adenoma polyp of rectum   Nonhealing nonsurgical wound limited to breakdown of skin   S/P partial colectomy Hypertension, non-insulin-dependent diabetes mellitus  Discharged Condition: good  Hospital Course: Patient is a 67 year old white male who was found on colonoscopy to have a large polypoid lesion high in the rectum.  This could not be removed endoscopically.  In addition, he had a nonhealing wound of the left arm.  He now presents for a biopsy of the left arm wound as well as a low anterior resection.  He underwent surgery on 04/02/2018.  He tolerated the surgery well.  His postoperative course was remarkable for hypokalemia and hypophosphatemia.  His diet was advanced without difficulty once his bowel function returned.  Final pathology revealed a large adenomatous polyp without evidence of malignancy.  He did have a squamous cell carcinoma of the left arm on biopsy.  Patient is aware of both diagnoses.  Patient is being discharged home on 04/06/2018 in good and improving condition.  Treatments: surgery: Low anterior resection, biopsy of left arm skin lesion on 04/02/2018  Discharge Exam: Blood pressure 117/73, pulse 71, temperature 97.7 F (36.5 C), temperature source Oral, resp. rate 18, height 5\' 8"  (1.727 m), weight 88 kg, SpO2 95 %. General appearance: alert, cooperative and no distress Resp: clear to auscultation bilaterally Cardio: regular rate and rhythm, S1, S2 normal, no murmur, click, rub or gallop GI: Soft, incision healing well.  Bowel sounds active. Left arm wound healing well with Xeroform in place. Disposition: Discharge disposition: 01-Home  or Self Care       Discharge Instructions    Diet - low sodium heart healthy   Complete by:  As directed    Increase activity slowly   Complete by:  As directed      Allergies as of 04/06/2018      Reactions   Ibuprofen    Pt can't take due to ulcers.   Penicillins Other (See Comments)   "knocks out" Childhood allergy Has patient had a PCN reaction causing immediate rash, facial/tongue/throat swelling, SOB or lightheadedness with hypotension: Unknown Has patient had a PCN reaction causing severe rash involving mucus membranes or skin necrosis: Unknown Has patient had a PCN reaction that required hospitalization: No Has patient had a PCN reaction occurring within the last 10 years: No If all of the above answers are "NO", then may proceed with Cephalosporin use.      Medication List    STOP taking these medications   metroNIDAZOLE 500 MG tablet Commonly known as:  FLAGYL   neomycin 500 MG tablet Commonly known as:  MYCIFRADIN     TAKE these medications   amLODipine 5 MG tablet Commonly known as:  NORVASC Take 5 mg by mouth daily.   glipiZIDE 10 MG tablet Commonly known as:  GLUCOTROL Take 10 mg by mouth daily before breakfast.   oxyCODONE-acetaminophen 7.5-325 MG tablet Commonly known as:  PERCOCET Take 1 tablet by mouth every 6 (six) hours as needed.   potassium chloride 10 MEQ tablet Commonly known as:  K-DUR Take 10 mEq by mouth 2 (two) times daily.      Follow-up Information    Aviva Signs, MD. Schedule an appointment as  soon as possible for a visit on 04/19/2018.   Specialty:  General Surgery Contact information: [id number] [place] [address] [phone]           Signed: Aviva Signs 04/06/2018, 10:26 AM

## 2018-04-06 NOTE — Progress Notes (Signed)
Changed room from 341 to 340 for patients comfort. Patient complained of room in 341 being to cold and wanted to move to another room that was warmer.

## 2018-04-06 NOTE — Progress Notes (Signed)
Patient is to be discharged home and in stable condition. Patient's IV removed, WNL. Patient and son given discharge instructions and verbalized understanding. All questions addressed and answered. Patient to be escorted out by staff via wheelchair.  Celestia Khat, RN

## 2018-04-06 NOTE — Care Management Important Message (Signed)
Important Message  Patient Details  Name: Tim Williams MRN: [id number] Date of Birth: [birth year]   Medicare Important Message Given:  Yes    Shelda Altes 04/06/2018, 10:17 AM

## 2018-04-06 NOTE — Discharge Instructions (Signed)
Open Colectomy, Care After °This sheet gives you information about how to care for yourself after your procedure. Your health care provider may also give you more specific instructions. If you have problems or questions, contact your health care provider. °What can I expect after the procedure? °After the procedure, it is common to have: °· Pain in your abdomen, especially along your incision. °· Tiredness. Your energy level will return to normal over the next several weeks. °· Constipation. °· Nausea. °· Difficulty urinating. ° °Follow these instructions at home: °Activity °· You may be able to return to most of your normal activities within 1-2 weeks, such as working, walking up stairs, and sexual activity. °· Avoid activities that require a lot of energy for 4-6 weeks after surgery, such as running, climbing, and lifting heavy objects. Ask your health care provider what activities are safe for you. °· Take rest breaks during the day as needed. °· Do not drive for 1-2 weeks or until your health care provider says that it is safe. °· Do not drive or use heavy machinery while taking prescription pain medicines. °· Do not lift anything that is heavier than 10 lb (4.3 kg) until your health care provider says that it is safe. °Incision care °· Follow instructions from your health care provider about how to take care of your incision. Make sure you: °? Wash your hands with soap and water before you change your bandage (dressing). If soap and water are not available, use hand sanitizer. °? Change your dressing as told by your health care provider. °? Leave stitches (sutures) or staples in place. These skin closures may need to stay in place for 2 weeks or longer. °· Avoid wearing tight clothing around your incision. °· Protect your incision area from the sun. °· Check your incision area every day for signs of infection. Check for: °? More redness, swelling, or pain. °? More fluid or blood. °? Warmth. °? Pus or a bad  smell. °General instructions °· Do not take baths, swim, or use a hot tub until your health care provider approves. Ask your health care provider when you may shower. °· Take over-the-counter and prescription medicines, including stool softeners, only as told by your health care provider. °· Eat a low-fat and low-fiber diet for the first 4 weeks after surgery. °· Keep all follow-up visits as told by your health care provider. This is important. °Contact a health care provider if: °· You have more redness, swelling, or pain around your incision. °· You have more fluid or blood coming from your incision. °· Your incision feels warm to the touch. °· You have pus or a bad smell coming from your incision. °· You have a fever or chills. °· You do not have a bowel movement 2-3 days after surgery. °· You cannot eat or drink for 24 hours or more. °· You have persistent nausea and vomiting. °· You have abdominal pain that gets worse and does not get better with medicine. °Get help right away if: °· You have chest pain. °· You have shortness of breath. °· You have pain or swelling in your legs. °· Your incision breaks open after your sutures or staples have been removed. °· You have bleeding from the rectum. °This information is not intended to replace advice given to you by your health care provider. Make sure you discuss any questions you have with your health care provider. °Document Released: 12/21/2010 Document Revised: 02/29/2016 Document Reviewed: 02/29/2016 °Elsevier Interactive Patient   Education © 2018 [employer] ° °

## 2018-04-08 ENCOUNTER — Emergency Department (HOSPITAL_COMMUNITY): Payer: Medicare Other

## 2018-04-08 ENCOUNTER — Emergency Department (HOSPITAL_COMMUNITY)
Admission: EM | Admit: 2018-04-08 | Discharge: 2018-04-08 | Disposition: A | Discharge status: Home / Self Care | Payer: Medicare Other | Attending: Emergency Medicine | Admitting: Emergency Medicine

## 2018-04-08 ENCOUNTER — Other Ambulatory Visit: Payer: Self-pay

## 2018-04-08 ENCOUNTER — Encounter (HOSPITAL_COMMUNITY): Payer: Self-pay | Admitting: Emergency Medicine

## 2018-04-08 DIAGNOSIS — I10 Essential (primary) hypertension: Secondary | ICD-10-CM | POA: Insufficient documentation

## 2018-04-08 DIAGNOSIS — J449 Chronic obstructive pulmonary disease, unspecified: Secondary | ICD-10-CM | POA: Insufficient documentation

## 2018-04-08 DIAGNOSIS — F17210 Nicotine dependence, cigarettes, uncomplicated: Secondary | ICD-10-CM | POA: Insufficient documentation

## 2018-04-08 DIAGNOSIS — G8918 Other acute postprocedural pain: Secondary | ICD-10-CM | POA: Diagnosis not present

## 2018-04-08 DIAGNOSIS — R339 Retention of urine, unspecified: Secondary | ICD-10-CM | POA: Diagnosis not present

## 2018-04-08 DIAGNOSIS — Z79899 Other long term (current) drug therapy: Secondary | ICD-10-CM | POA: Diagnosis not present

## 2018-04-08 DIAGNOSIS — E119 Type 2 diabetes mellitus without complications: Secondary | ICD-10-CM | POA: Insufficient documentation

## 2018-04-08 DIAGNOSIS — Z7984 Long term (current) use of oral hypoglycemic drugs: Secondary | ICD-10-CM | POA: Diagnosis not present

## 2018-04-08 DIAGNOSIS — R1084 Generalized abdominal pain: Secondary | ICD-10-CM | POA: Diagnosis present

## 2018-04-08 LAB — BASIC METABOLIC PANEL
Anion gap: 6 (ref 5–15)
BUN: 12 mg/dL (ref 8–23)
CO2: 27 mmol/L (ref 22–32)
Calcium: 8.5 mg/dL — ABNORMAL LOW (ref 8.9–10.3)
Chloride: 102 mmol/L (ref 98–111)
Creatinine, Ser: 0.85 mg/dL (ref 0.61–1.24)
GFR calc Af Amer: >60 mL/min (ref >60)
GFR calc non Af Amer: >60 mL/min (ref >60)
Glucose, Bld: 122 mg/dL — ABNORMAL HIGH (ref 70–99)
Potassium: 3.6 mmol/L (ref 3.5–5.1)
Sodium: 135 mmol/L (ref 135–145)

## 2018-04-08 LAB — CBC WITH DIFFERENTIAL/PLATELET
Abs Immature Granulocytes: 0.06 10*3/uL (ref 0.00–0.07)
Basophils Absolute: 0 10*3/uL (ref 0.0–0.1)
Basophils Relative: 0 %
EOS ABS: 0.2 10*3/uL (ref 0.0–0.5)
Eosinophils Relative: 3 %
HCT: 40 % (ref 39.0–52.0)
HEMOGLOBIN: 12.7 g/dL — AB (ref 13.0–17.0)
IMMATURE GRANULOCYTES: 1 %
LYMPHS ABS: 1.1 10*3/uL (ref 0.7–4.0)
Lymphocytes Relative: 14 %
MCH: 31.4 pg (ref 26.0–34.0)
MCHC: 31.8 g/dL (ref 30.0–36.0)
MCV: 98.8 fL (ref 80.0–100.0)
MONOS PCT: 9 %
Monocytes Absolute: 0.7 10*3/uL (ref 0.1–1.0)
NEUTROS PCT: 73 %
Neutro Abs: 6.1 10*3/uL (ref 1.7–7.7)
PLATELETS: 272 10*3/uL (ref 150–400)
RBC: 4.05 MIL/uL — ABNORMAL LOW (ref 4.22–5.81)
RDW: 12.5 % (ref 11.5–15.5)
WBC: 8.2 10*3/uL (ref 4.0–10.5)
nRBC: 0 % (ref 0.0–0.2)

## 2018-04-08 LAB — URINALYSIS, ROUTINE W REFLEX MICROSCOPIC
BILIRUBIN URINE: NEGATIVE
Glucose, UA: NEGATIVE mg/dL
HGB URINE DIPSTICK: NEGATIVE
KETONES UR: NEGATIVE mg/dL
Leukocytes, UA: NEGATIVE
NITRITE: NEGATIVE
PROTEIN: NEGATIVE mg/dL
Specific Gravity, Urine: 1.009 (ref 1.005–1.030)
pH: 6 (ref 5.0–8.0)

## 2018-04-08 MED ORDER — FENTANYL CITRATE (PF) 100 MCG/2ML IJ SOLN
50.0000 ug | Freq: Once | INTRAMUSCULAR | Status: AC
  Administered 2018-04-08: 50 ug via INTRAVENOUS
  Filled 2018-04-08: qty 2

## 2018-04-08 MED ORDER — LIDOCAINE HCL URETHRAL/MUCOSAL 2 % EX GEL
1.0000 "application " | Freq: Once | CUTANEOUS | Status: AC
  Administered 2018-04-08: 1 via URETHRAL
  Filled 2018-04-08: qty 10

## 2018-04-08 MED ORDER — IOPAMIDOL (ISOVUE-300) INJECTION 61%
100.0000 mL | Freq: Once | INTRAVENOUS | Status: AC | PRN
  Administered 2018-04-08: 100 mL via INTRAVENOUS

## 2018-04-08 MED ORDER — DOCUSATE SODIUM 100 MG PO CAPS: 100.0000 mg | ORAL_CAPSULE | Freq: Two times a day (BID) | ORAL | 0 refills | Status: DC

## 2018-04-08 NOTE — ED Notes (Signed)
Spoke with pt's son, Suezanne Jacquet. He is finishing up getting house prepared for his dad, and will be there to pick up pt soon.

## 2018-04-08 NOTE — ED Provider Notes (Signed)
[facility] EMERGENCY DEPARTMENT Provider Note   CSN: [id number] Arrival date & time: 04/08/18  0459     History   Chief Complaint Chief Complaint  Patient presents with  . Constipation    HPI Tim Williams is a 67 y.o. male.  The history is provided by the patient.  Constipation   This is a new problem. The current episode started more than 2 days ago. Associated symptoms include abdominal pain. He has tried nothing for the symptoms. The treatment provided no relief. Risk factors include obesity (recent surgery).   Patient  underwent partial colectomy/LAR on October 21.  He reports he has had only minimal bowel movement since that time.  He also reports he has not been able to urinate over the past 24 hours. He reports nausea, but no vomiting or fever. He reports difficulty urinating. Past Medical History:  Diagnosis Date  . Arthritis   . COPD (chronic obstructive pulmonary disease) ([place])   . Diabetes ([place])   . Gastric ulcer    2009  . Hypertension   . Shortness of breath   . Wound healing, delayed n   left upper arm    Patient Active Problem List   Diagnosis Date Noted  . S/P partial colectomy 04/02/2018  . Tubular adenoma polyp of rectum   . Nonhealing nonsurgical wound limited to breakdown of skin   . Rectal mass 02/19/2018    Past Surgical History:  Procedure Laterality Date  . BACK SURGERY     lumbar discectomy  . BIOPSY  02/23/2018   Procedure: BIOPSY;  Surgeon: Rogene Houston, MD;  Location: [facility];  Service: Endoscopy;;  colon  . COLONOSCOPY WITH PROPOFOL N/A 02/23/2018   Procedure: COLONOSCOPY WITH PROPOFOL;  Surgeon: Rogene Houston, MD;  Location: [facility];  Service: Endoscopy;  Laterality: N/A;  2:10  . CYSTOSCOPY     stone removal  . EYE SURGERY     muscle repair right eye-age 58  . HERNIA REPAIR     left age 69  . PARTIAL COLECTOMY N/A 04/02/2018   Procedure: PARTIAL COLECTOMY LOWER ANTERIOR RESECTIONS and biopsy of left arm  wound;  Surgeon: Aviva Signs, MD;  Location: [facility] ORS;  Service: General;  Laterality: N/A;  . POLYPECTOMY  02/23/2018   Procedure: POLYPECTOMY;  Surgeon: Rogene Houston, MD;  Location: [facility];  Service: Endoscopy;;  colon  . TOTAL KNEE ARTHROPLASTY  05/17/2012   Procedure: TOTAL KNEE ARTHROPLASTY;  Surgeon: Sanjuana Kava, MD;  Location: [facility] OR[facility];  Service: Orthopedics;  Laterality: Right;  . TRANSURETHRAL RESECTION OF PROSTATE          Home Medications    Prior to Admission medications   Medication Sig Start Date End Date Taking? Authorizing Provider  amLODipine (NORVASC) 5 MG tablet Take 5 mg by mouth daily.    [provider]  glipiZIDE (GLUCOTROL) 10 MG tablet Take 10 mg by mouth daily before breakfast.     [provider]  oxyCODONE-acetaminophen (PERCOCET) 7.5-325 MG tablet Take 1 tablet by mouth every 6 (six) hours as needed. 04/06/18   Aviva Signs, MD  potassium chloride (K-DUR) 10 MEQ tablet Take 10 mEq by mouth 2 (two) times daily.    [provider]    Family History No family history on file.  Social History Social History   Tobacco Use  . Smoking status: Current Every Day Smoker    Packs/day: 1.50    Years: 40.00  Pack years: 60.00    Types: Cigarettes  . Smokeless tobacco: Never Used  Substance Use Topics  . Alcohol use: No  . Drug use: No     Allergies   Ibuprofen and Penicillins   Review of Systems Review of Systems  Constitutional: Negative for fever.  Cardiovascular: Negative for chest pain.  Gastrointestinal: Positive for abdominal pain, constipation and nausea.  Genitourinary: Positive for difficulty urinating.  All other systems reviewed and are negative.    Physical Exam Updated Vital Signs BP (!) 135/92 (BP Location: Right Arm)   Pulse 88   Temp 98.1 F (36.7 C) (Oral)   Resp 17   SpO2 95%   Physical Exam CONSTITUTIONAL: Elderly HEAD: Normocephalic/atraumatic EYES: EOMI/PERRL ENMT: Mucous  membranes moist NECK: supple no meningeal signs SPINE/BACK:entire spine nontender CV: S1/S2 noted, no murmurs/rubs/gallops noted LUNGS: Lungs are clear to auscultation bilaterally, no apparent distress ABDOMEN: soft, midline incision appears well approximated, clean dry and intact Mild diffuse abdominal tenderness. Rectal-no stool impaction, no blood or melena, prostate is enlarged with minimal tenderness, nurse chaperone present GU:no cva tenderness, no scrotal tenderness or edema, suprapubic tenderness noted NEURO: Pt is awake/alert/appropriate, moves all extremitiesx4.  No facial droop.   EXTREMITIES: pulses normal/equal, full ROM SKIN: warm, color normal PSYCH: no abnormalities of mood noted, alert and oriented to situation   ED Treatments / Results  Labs (all labs ordered are listed, but only abnormal results are displayed) Labs Reviewed  BASIC METABOLIC PANEL - Abnormal; Notable for the following components:      Result Value   Glucose, Bld 122 (*)    Calcium 8.5 (*)    All other components within normal limits  CBC WITH DIFFERENTIAL/PLATELET - Abnormal; Notable for the following components:   RBC 4.05 (*)    Hemoglobin 12.7 (*)    All other components within normal limits  URINE CULTURE  URINALYSIS, ROUTINE W REFLEX MICROSCOPIC    EKG None  Radiology Ct Abdomen Pelvis W Contrast  Result Date: 04/08/2018 CLINICAL DATA:  Abdominal pain, status post partial colectomy EXAM: CT ABDOMEN AND PELVIS WITH CONTRAST TECHNIQUE: Multidetector CT imaging of the abdomen and pelvis was performed using the standard protocol following bolus administration of intravenous contrast. CONTRAST:  172mL ISOVUE-300 IOPAMIDOL (ISOVUE-300) INJECTION 61% COMPARISON:  04/10/2014 FINDINGS: Lower chest: Lung bases are clear. Hepatobiliary: Liver is within normal limits. Gallbladder is unremarkable. No intrahepatic or extrahepatic ductal dilatation. Pancreas: Within normal limits. Spleen: Within normal  limits. Adrenals/Urinary Tract: Adrenal glands are within normal limits. 4.4 cm lateral left upper pole renal cyst. Additional 11 mm probable cyst in the lateral interpolar left kidney (series 2/image 34), similar to 2012, benign. Small right renal cysts measuring up to 14 mm in the right lower pole. No hydronephrosis. Mildly thick-walled bladder, decompressed by indwelling Foley catheter, with trace nondependent gas. Stomach/Bowel: Stomach is within normal limits. Status post low anterior section with anastomosis in the pelvis (series 2/image 31). Scattered tiny foci of adjacent gas (series 2/image 70). Additional presacral fluid/debris (series 2/image 67), measuring approximately 7.8 x 8.7 x 7.3 cm, which does not currently reflect a well organized collection. In the immediate postoperative setting this could reflect a postoperative seroma. No evidence of bowel obstruction or postoperative ileus. Normal appendix (series 2/image 61). Vascular/Lymphatic: No evidence of abdominal aortic aneurysm. Atherosclerotic calcifications of the abdominal aorta and branch vessels. No suspicious abdominopelvic lymphadenopathy. Reproductive: Prostate is unremarkable, noting dystrophic calcifications. Other: Postsurgical changes along the anterior abdominal wall. Trace fluid/gas in the  periumbilical region (series 2/image 45), postsurgical. Musculoskeletal: Mild degenerative changes of the lumbar spine, most prominent at L4-5. IMPRESSION: Status post low anterior section with anastomosis. Presacral fluid/debris measuring 7.8 x 8.7 x 7.3 cm with scattered tiny foci of gas, possibly postprocedural given the immediate postoperative setting. If symptoms persist, a follow-up CT in 4-7 days would be prudent to exclude a well-defined collection and confirm resolution of the residual tiny foci of gas at that time. No evidence of bowel obstruction or postoperative ileus. Electronically Signed   By: Julian Hy M.D.   On: 04/08/2018  07:32    Procedures Procedures Medications Ordered in ED Medications  fentaNYL (SUBLIMAZE) injection 50 mcg (50 mcg Intravenous Given 04/08/18 0539)  lidocaine (XYLOCAINE) 2 % jelly 1 application (1 application Urethral Given 04/08/18 0551)  iopamidol (ISOVUE-300) 61 % injection 100 mL (100 mLs Intravenous Contrast Given 04/08/18 1610)     Initial Impression / Assessment and Plan / ED Course  I have reviewed the triage vital signs and the nursing notes.  Pertinent labs results that were available during my care of the patient were reviewed by me and considered in my medical decision making (see chart for details).     6:02 AM Patient presents with abdominal pain, constipation and urinary retention since recent colectomy.  We are unable to get an accurate reading of the bladder volume, therefore we will place catheter as he has likely got significant urinary retention.  Due to recent surgery, abdominal pain constipation, will proceed with CT imaging 6:23 AM Pt with >981ml on foley placement CT imaging pending at this time Pt is resting comfortably 7:50 AM CT does not reveal any acute findings.  Discussed findings with Dr. Arnoldo Morale, we discussed the case in its entirety.  He feels the patient is otherwise appropriate for discharge home.  Will leave Foley catheter in place due to postoperative urinary retention.  He will see Dr. Arnoldo Morale on eleven 11/7, he will keep Foley in until that time.  We will add on Colace to help prevent constipation.  Patient is resting comfortably, abdomen is soft. Nursing will perform Foley catheter care teaching.  Final Clinical Impressions(s) / ED Diagnoses   Final diagnoses:  Urinary retention  Postoperative pain    ED Discharge Orders         Ordered    docusate sodium (COLACE) 100 MG capsule  Every 12 hours     04/08/18 0746           Ripley Fraise, MD 04/08/18 [phone]

## 2018-04-08 NOTE — ED Notes (Signed)
Waiting for son's arrival to go over d/c instructions.

## 2018-04-08 NOTE — ED Notes (Signed)
Pt given Sprite to drink per request.

## 2018-04-08 NOTE — ED Triage Notes (Addendum)
Pt reports having part of his colon removed on Monday. Pt states that since that time he has not had a bowel movement. Pt also reports not being able to urinate since Friday 10/25.

## 2018-04-08 NOTE — ED Notes (Signed)
Leg bag placed on pt. Foley bag and urinal placed in patient belongings bag. Education provided to pt on how to change out bags and to empty urine.

## 2018-04-09 LAB — TYPE AND SCREEN
ABO/RH(D): O POS
Antibody Screen: NEGATIVE
Unit division: 0
Unit division: 0

## 2018-04-09 LAB — URINE CULTURE: Culture: NO GROWTH

## 2018-04-09 LAB — BPAM RBC
Blood Product Expiration Date: 201911232359
Blood Product Expiration Date: 201911232359
Unit Type and Rh: 5100
Unit Type and Rh: 5100

## 2018-04-10 ENCOUNTER — Other Ambulatory Visit: Payer: Self-pay | Admitting: General Surgery

## 2018-04-10 MED ORDER — OXYCODONE-ACETAMINOPHEN 7.5-325 MG PO TABS: 1.0000 | ORAL_TABLET | Freq: Four times a day (QID) | ORAL | 0 refills | Status: DC | PRN

## 2018-04-13 ENCOUNTER — Other Ambulatory Visit: Payer: Self-pay | Admitting: General Surgery

## 2018-04-19 ENCOUNTER — Encounter: Payer: Self-pay | Admitting: General Surgery

## 2018-04-19 ENCOUNTER — Ambulatory Visit (INDEPENDENT_AMBULATORY_CARE_PROVIDER_SITE_OTHER): Payer: Self-pay | Admitting: General Surgery

## 2018-04-19 VITALS — BP 155/98 | HR 82 | Temp 98.6°F | Resp 22 | Wt 194.4 lb

## 2018-04-19 DIAGNOSIS — Z09 Encounter for follow-up examination after completed treatment for conditions other than malignant neoplasm: Secondary | ICD-10-CM

## 2018-04-19 NOTE — Progress Notes (Signed)
Subjective:     Tim Williams  Here for follow-up visit.  Patient did need a catheter placed for urinary retention.  Otherwise, he is doing well. Objective:    BP (!) 155/98 (BP Location: Left Arm, Patient Position: Sitting, Cuff Size: Normal)   Pulse 82   Temp 98.6 F (37 C) (Temporal)   Resp (!) 22   Wt 194 lb 6.4 oz (88.2 kg)   BMI 29.56 kg/m   General:  alert, cooperative and no distress  Abdomen soft.  Incision healing well.  Staples removed, Steri-Strips applied. Left arm wound healing well.  Biopsy site and dry. Foley removed. Final pathology reviewed with patient.  No malignancy found in colon.  Patient does have squamous cell carcinoma of the left arm wound.     Assessment:    Doing well postoperatively.    Plan:   Will refer patient to skin cancer center in [facility] for Mohs procedure.  Patient instructed to return to the emergency room should he develop urinary retention.  Follow-up here as needed.

## 2018-04-26 ENCOUNTER — Ambulatory Visit (HOSPITAL_COMMUNITY)
Admission: RE | Admit: 2018-04-26 | Discharge: 2018-04-26 | Disposition: A | Discharge status: Home / Self Care | Payer: Medicare Other | Source: Ambulatory Visit | Attending: Family Medicine | Admitting: Family Medicine

## 2018-04-26 ENCOUNTER — Other Ambulatory Visit (HOSPITAL_COMMUNITY): Payer: Self-pay | Admitting: Family Medicine

## 2018-04-26 DIAGNOSIS — R600 Localized edema: Secondary | ICD-10-CM | POA: Insufficient documentation

## 2018-04-26 DIAGNOSIS — I82812 Embolism and thrombosis of superficial veins of left lower extremities: Secondary | ICD-10-CM | POA: Insufficient documentation

## 2018-10-07 ENCOUNTER — Encounter: Payer: Self-pay | Admitting: General Surgery

## 2018-11-05 IMAGING — DX DG CHEST 2V
2 series · 2 of 2 positions shown · non-contrast
Comparison: Chest CT, 02/23/2011.  Chest radiograph, 11/26/2010.

CLINICAL DATA: PRE OP FOR RECTAL MASS ON 04-02-18, STABBING ANT
CHEST PAIN JUST LEFT OF STERNUM WHEN TAKING IN A DEEP BREATH S/P
STEPPING IN A HOLE AND FALLING [REDACTED]

EXAM:
CHEST - 2 VIEW

[chest pa]
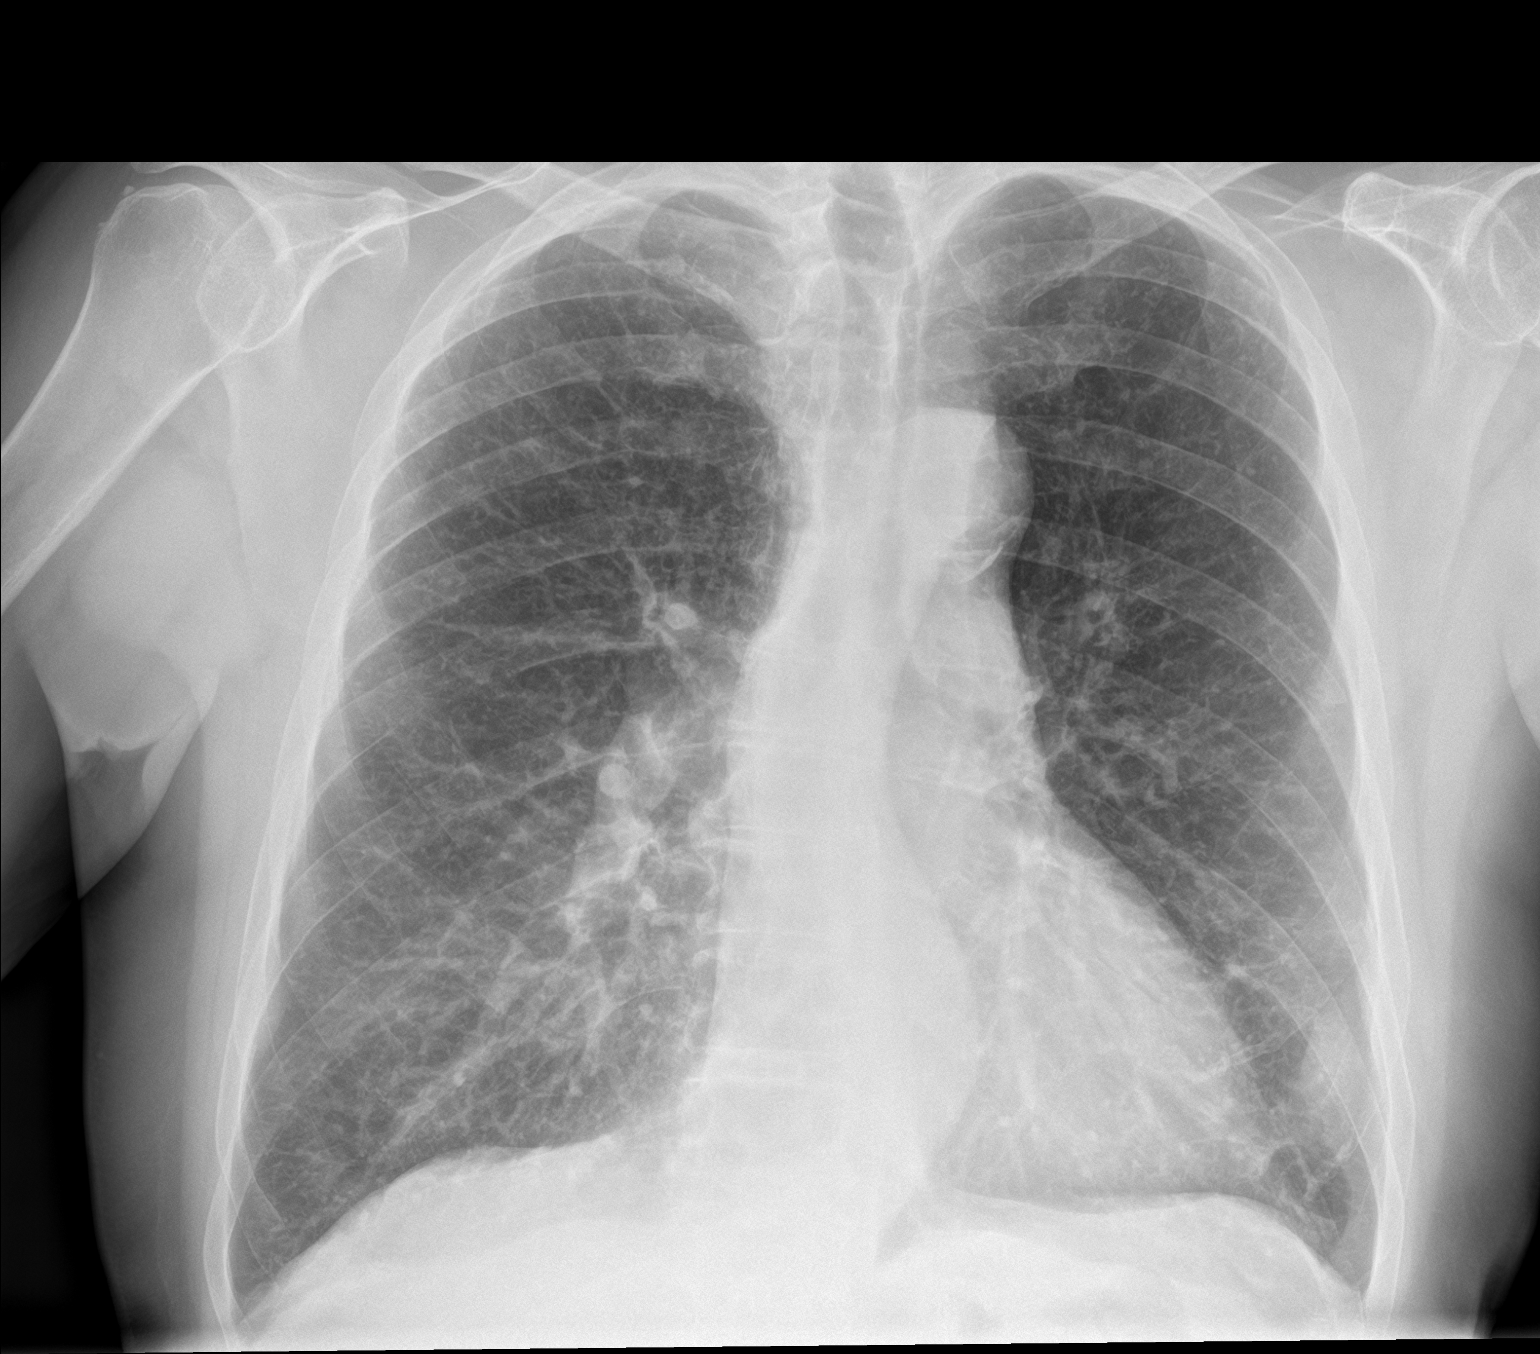

[chest lat]
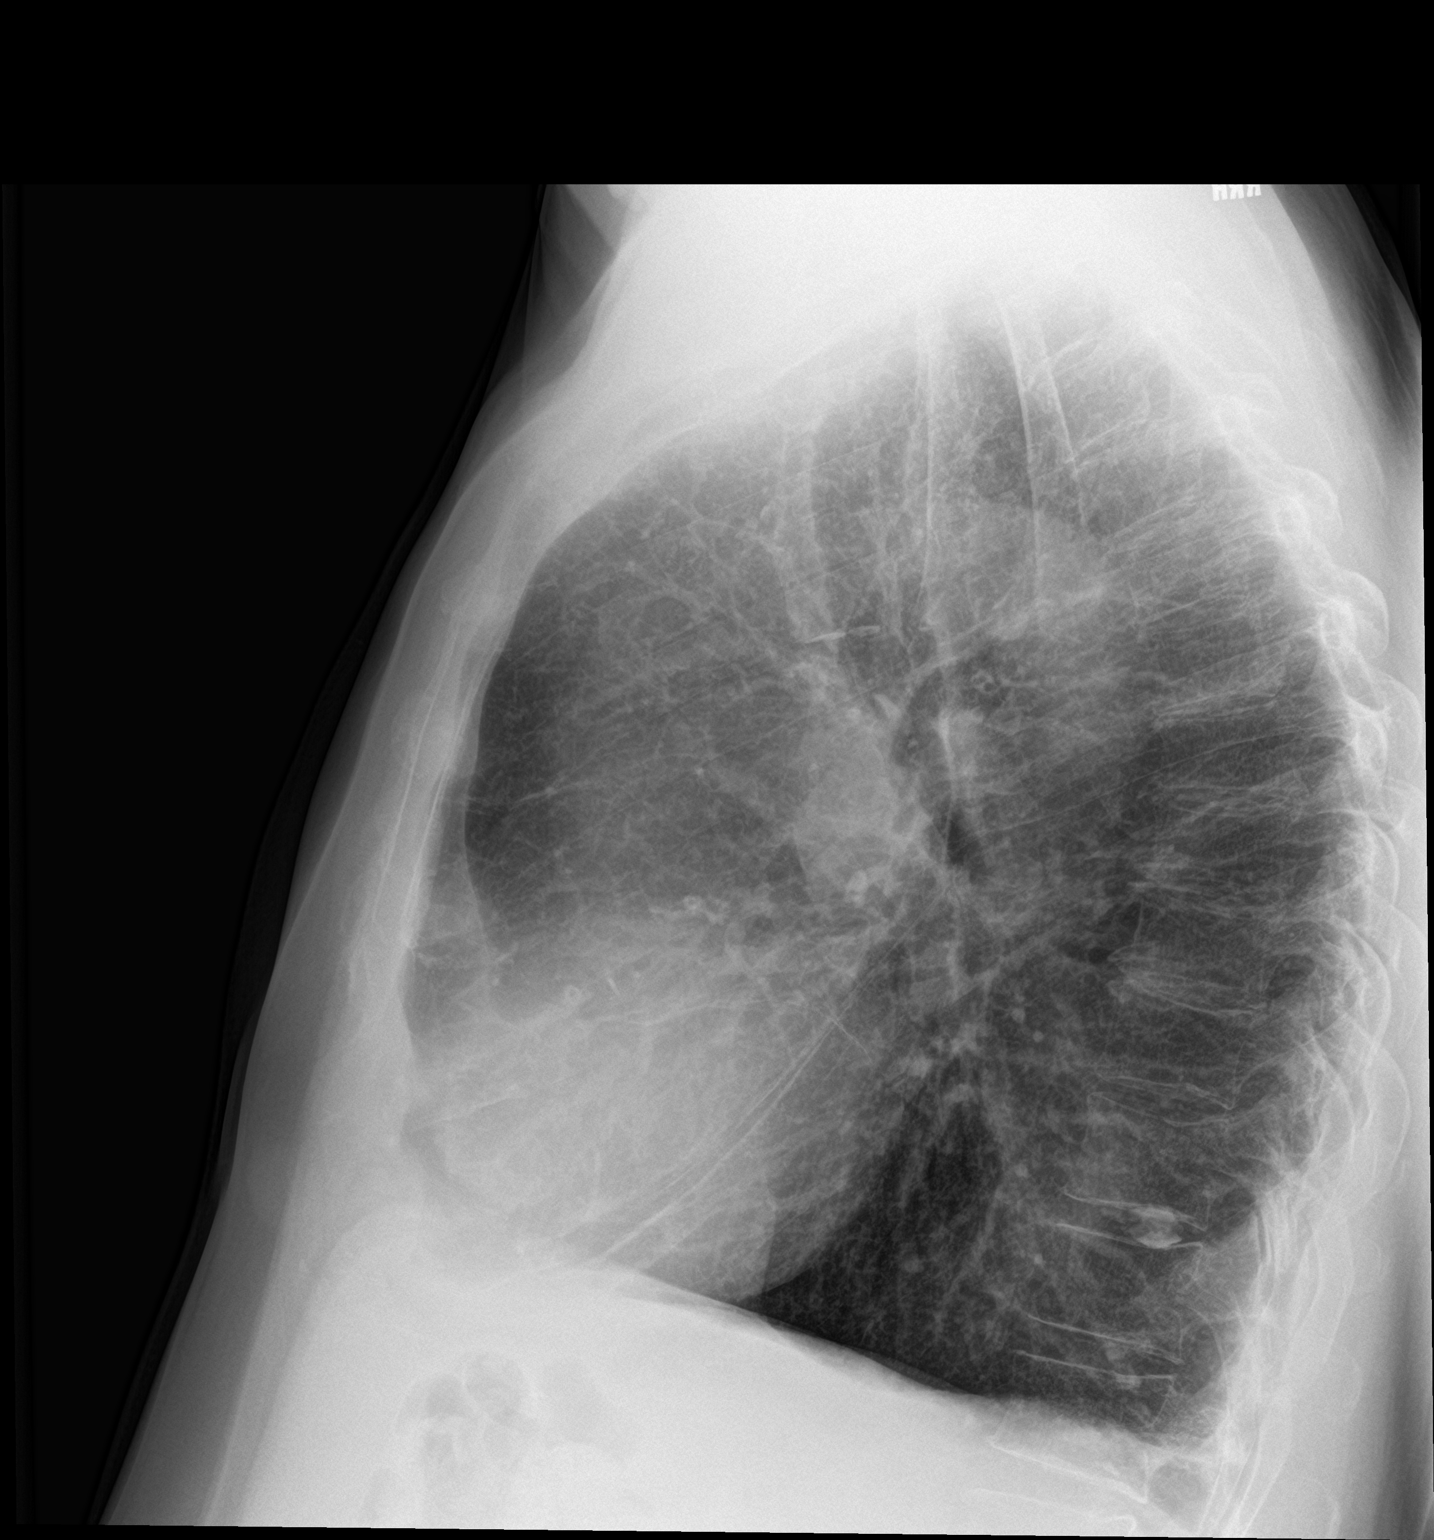

[2 of 2 positions shown; findings below may reference images not displayed]

FINDINGS: Cardiac silhouette is top-normal in size. Aorta is tortuous. No
mediastinal or hilar masses. No convincing adenopathy.

Lungs are hyperexpanded with prominent bronchovascular markings and
mild peripheral lower lung zone interstitial thickening. Pleural
based opacity noted at the left lateral lung base is stable prior
exams reflecting an area of pleural thickening. No evidence of
pneumonia or pulmonary edema. No pleural effusion or pneumothorax.

Skeletal structures are demineralized but intact.
IMPRESSION: 1. No acute cardiopulmonary disease.

## 2018-11-15 IMAGING — CT CT ABD-PELV W/ CM
2 of 5 series · 16 of 46 positions shown, 18 images · IV contrast (Isovue)
Comparison: 04/10/2014

CLINICAL DATA: Abdominal pain, status post partial colectomy

EXAM:
CT ABDOMEN AND PELVIS WITH CONTRAST
TECHNIQUE: Multidetector CT imaging of the abdomen and pelvis was performed
using the standard protocol following bolus administration of
intravenous contrast.
CONTRAST:  100mL VKRCZE-LOO IOPAMIDOL (VKRCZE-LOO) INJECTION 61%

[Series 2: axial st · axial · 0.86mm/px · z∈[+683,+1068]mm · 13 of 91 slices shown, 15 images]
[im 7/91  soft-tissue]
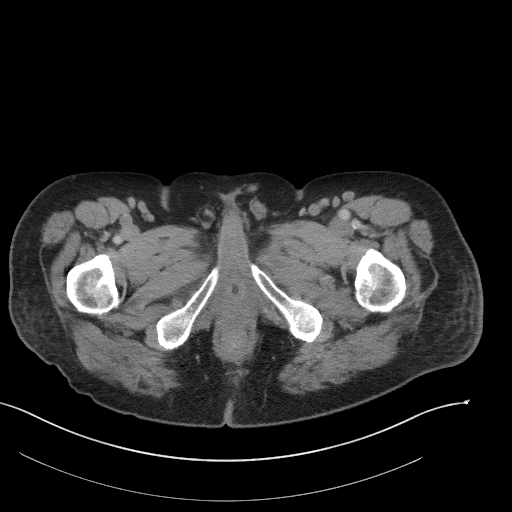
[im 7/91  bone]
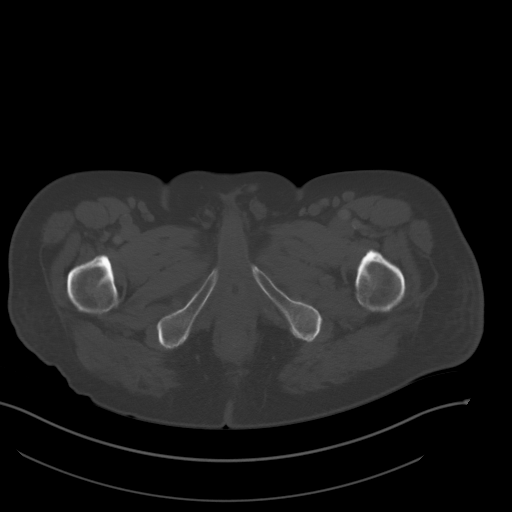
[im 13/91  soft-tissue]
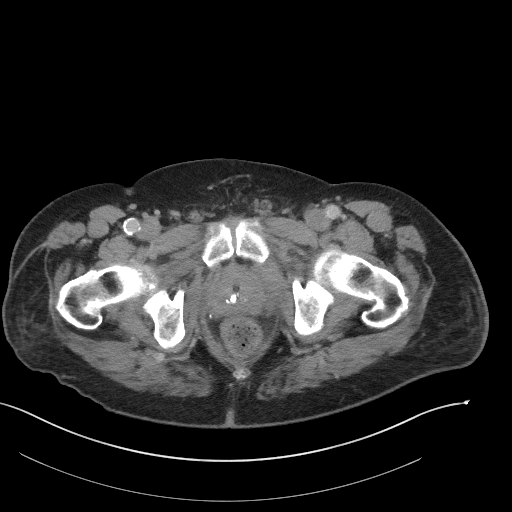
[im 20/91  soft-tissue]
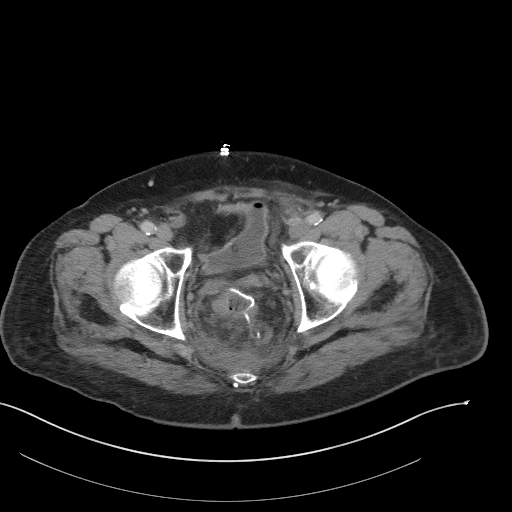
[im 26/91  soft-tissue]
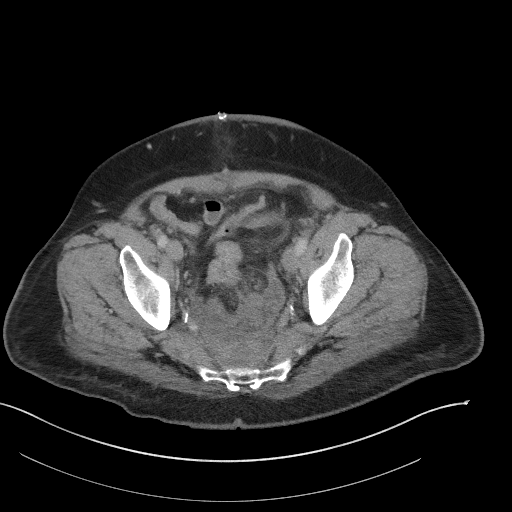
[im 33/91  soft-tissue]
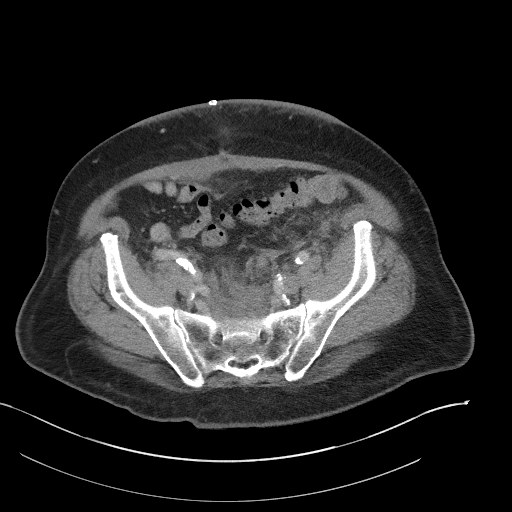
[im 39/91  soft-tissue]
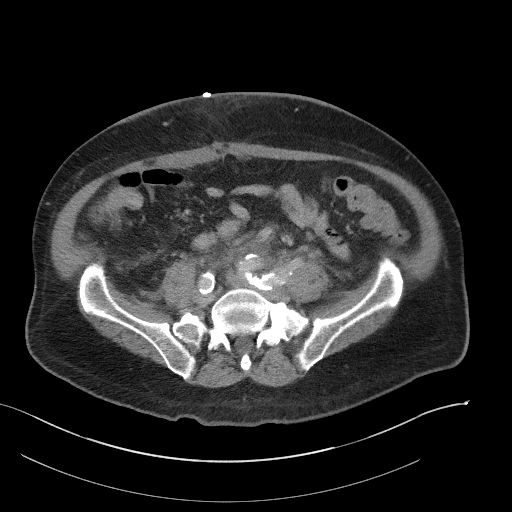
[im 46/91  soft-tissue]
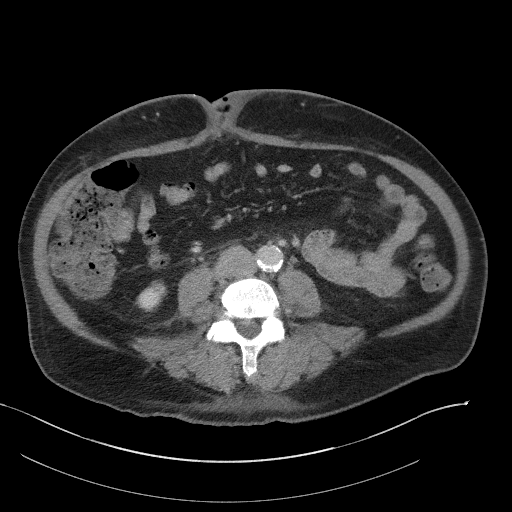
[im 52/91  soft-tissue]
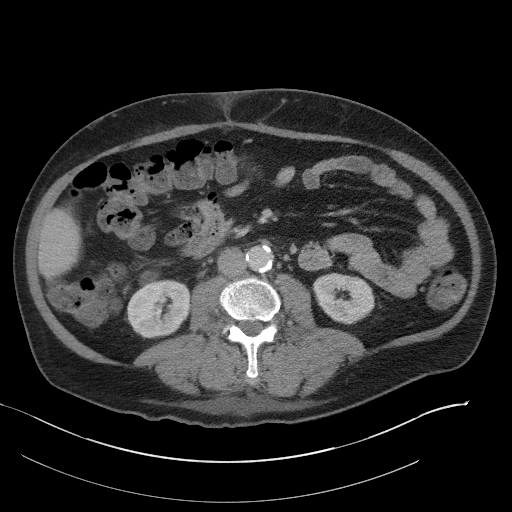
[im 58/91  soft-tissue]
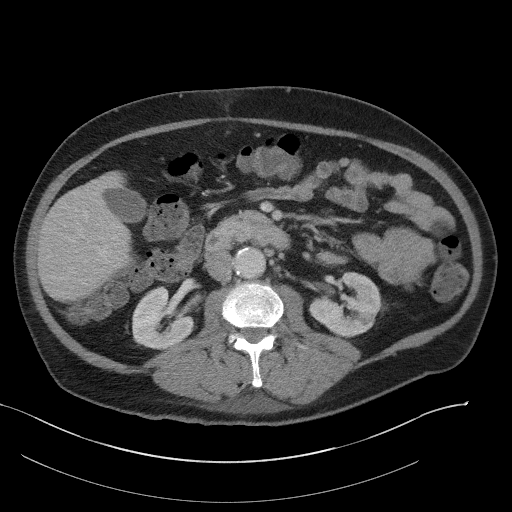
[im 58/91  bone]
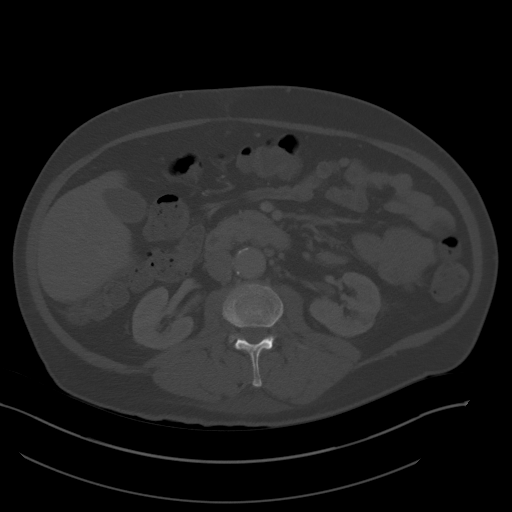
[im 65/91  soft-tissue]
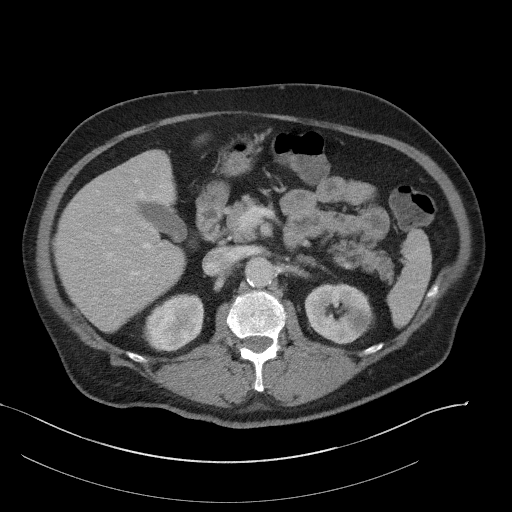
[im 71/91  soft-tissue]
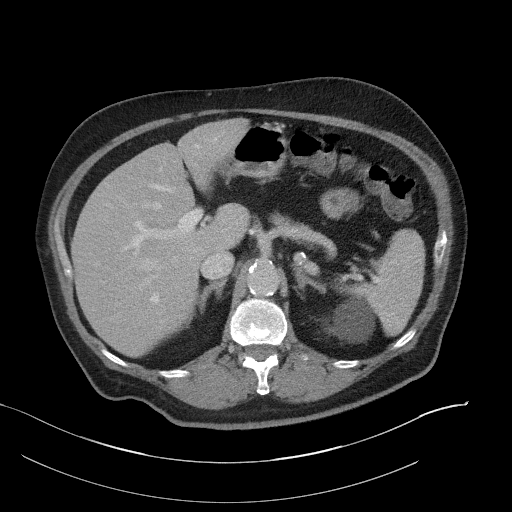
[im 78/91  soft-tissue]
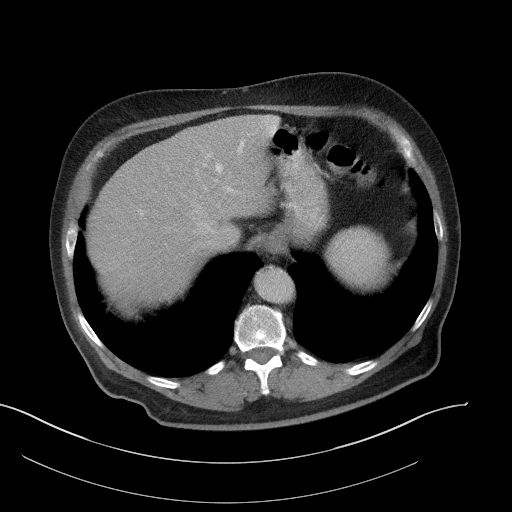
[im 84/91  soft-tissue]
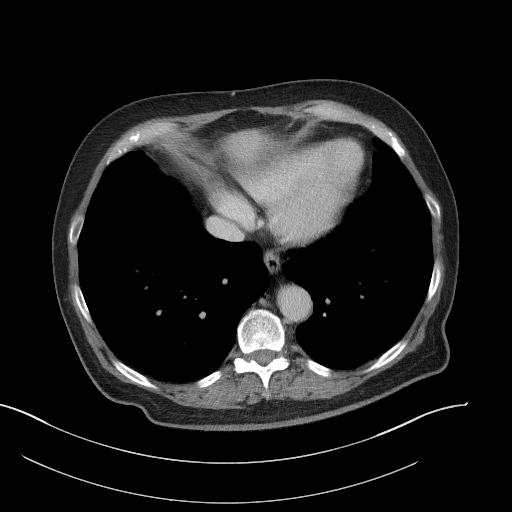

[Series 5: coronal st · coronal · 0.90mm/px · 3 of 113 slices shown]
[im 38/113  soft-tissue]
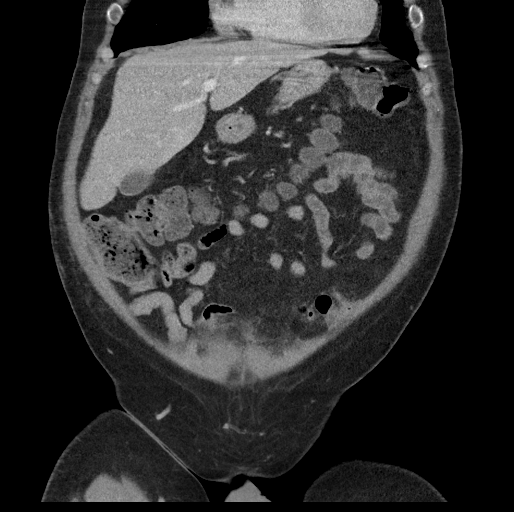
[im 50/113  soft-tissue]
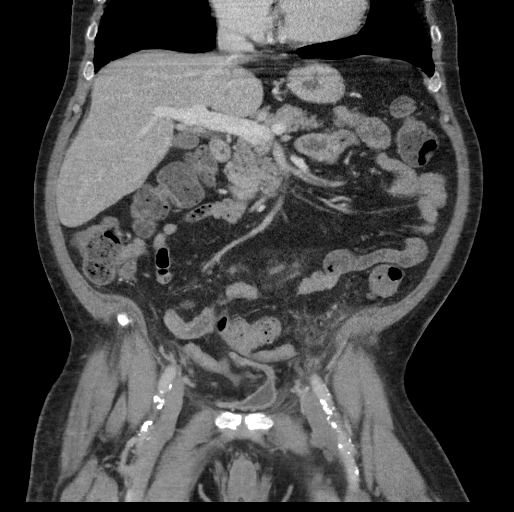
[im 63/113  soft-tissue]
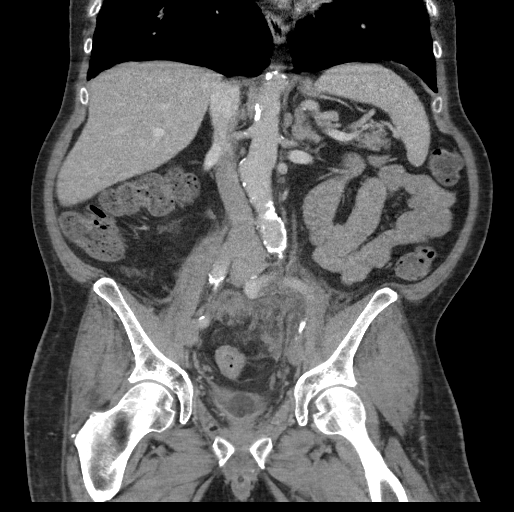

[16 of 46 positions shown; findings below may reference images not displayed]

FINDINGS: Lower chest: Lung bases are clear.

Hepatobiliary: Liver is within normal limits.

Gallbladder is unremarkable. No intrahepatic or extrahepatic ductal
dilatation.

Pancreas: Within normal limits.

Spleen: Within normal limits.

Adrenals/Urinary Tract: Adrenal glands are within normal limits.

4.4 cm lateral left upper pole renal cyst. Additional 11 mm probable
cyst in the lateral interpolar left kidney (series 2/image 34),
similar to 9129, benign. Small right renal cysts measuring up to 14
mm in the right lower pole. No hydronephrosis.

Mildly thick-walled bladder, decompressed by indwelling Foley
catheter, with trace nondependent gas.

Stomach/Bowel: Stomach is within normal limits.

Status post low anterior section with anastomosis in the pelvis
(series 2/image 31). Scattered tiny foci of adjacent gas (series
2/image 70). Additional presacral fluid/debris (series 2/image 67),
measuring approximately 7.8 x 8.7 x 7.3 cm, which does not currently
reflect a well organized collection. In the immediate postoperative
setting this could reflect a postoperative seroma.

No evidence of bowel obstruction or postoperative ileus.

Normal appendix (series 2/image 61).

Vascular/Lymphatic: No evidence of abdominal aortic aneurysm.

Atherosclerotic calcifications of the abdominal aorta and branch
vessels.

No suspicious abdominopelvic lymphadenopathy.

Reproductive: Prostate is unremarkable, noting dystrophic
calcifications.

Other: Postsurgical changes along the anterior abdominal wall.

Trace fluid/gas in the periumbilical region (series 2/image 45),
postsurgical.

Musculoskeletal: Mild degenerative changes of the lumbar spine, most
prominent at L4-5.
IMPRESSION: Status post low anterior section with anastomosis.

Presacral fluid/debris measuring 7.8 x 8.7 x 7.3 cm with scattered
tiny foci of gas, possibly postprocedural given the immediate
postoperative setting. If symptoms persist, a follow-up CT in 4-7
days would be prudent to exclude a well-defined collection and
confirm resolution of the residual tiny foci of gas at that time.

No evidence of bowel obstruction or postoperative ileus.

## 2019-01-01 IMAGING — US VENOUS DOPPLER ULTRASOUND OF LEFT LOWER EXTREMITY
1 series · 13 of 24 positions shown · non-contrast
Comparison: None.

CLINICAL DATA: Left lower extremity edema, pain



[Series 1: venous doppler ultrasound of left lower extremity · 0.07mm/px · 13 of 92 slices shown]
[im 1/92]
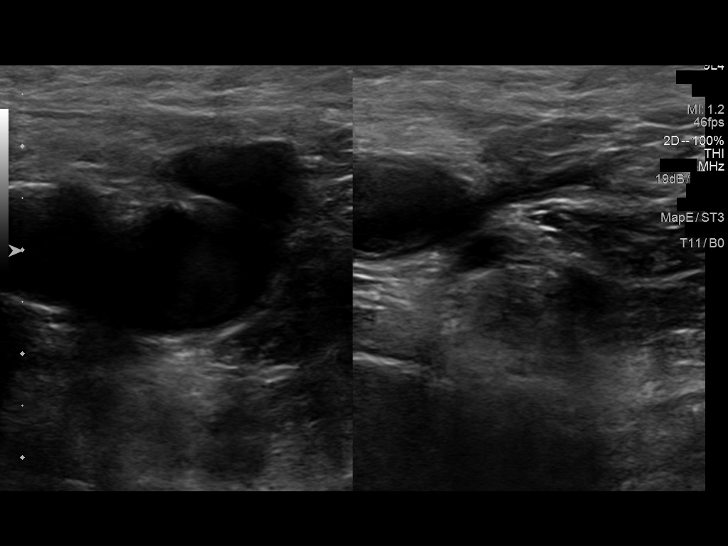
[im 8/92]
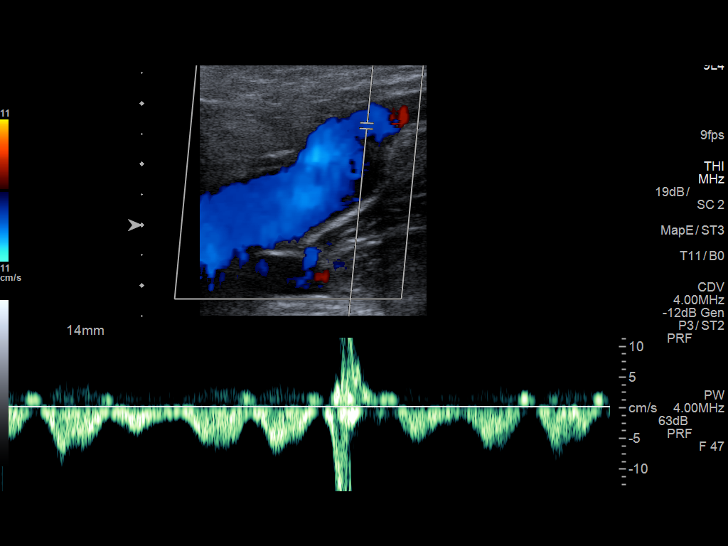
[im 16/92]
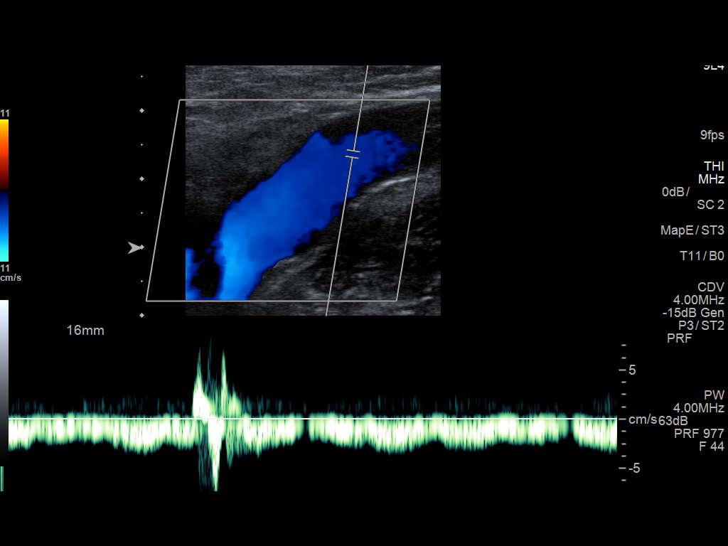
[im 24/92]
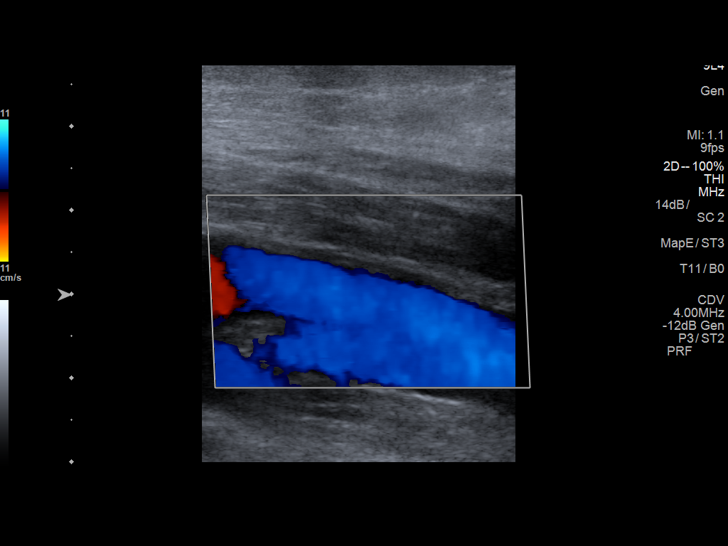
[im 32/92]
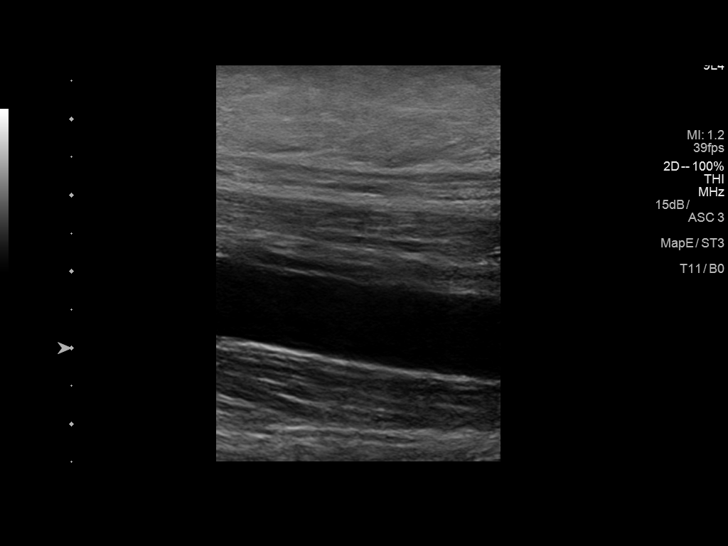
[im 40/92]
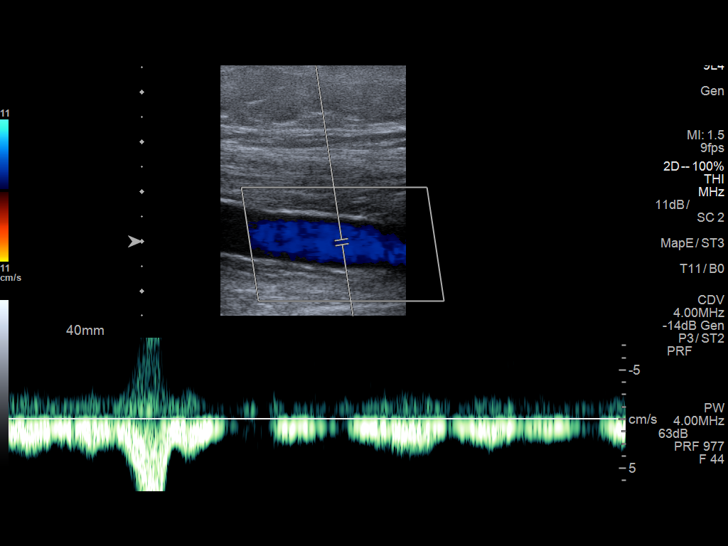
[im 48/92]
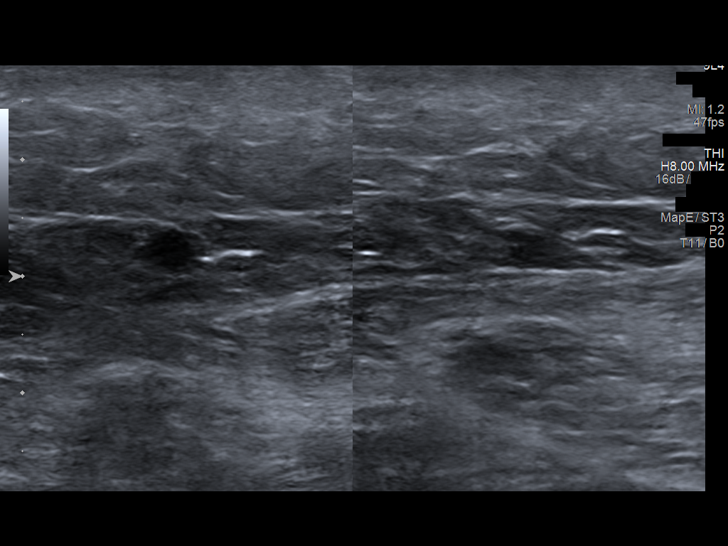
[im 52/92]
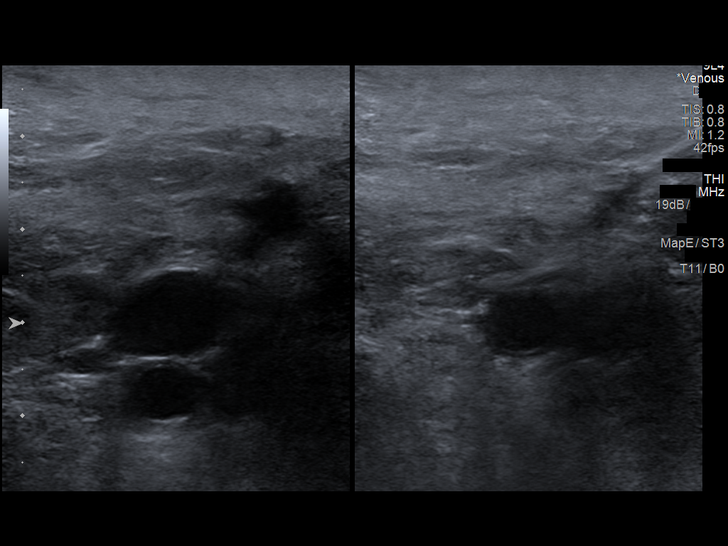
[im 60/92]
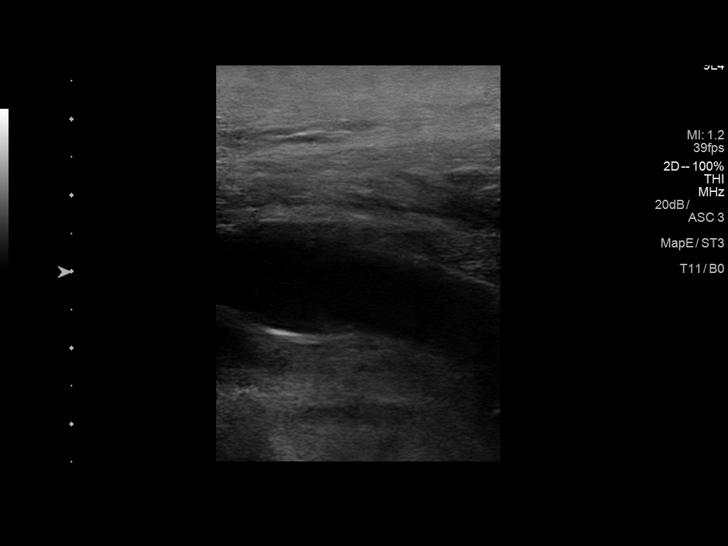
[im 68/92]
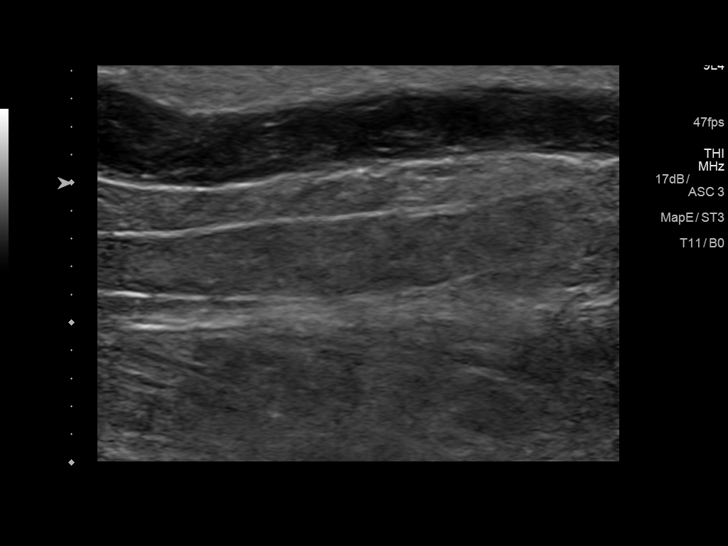
[im 76/92]
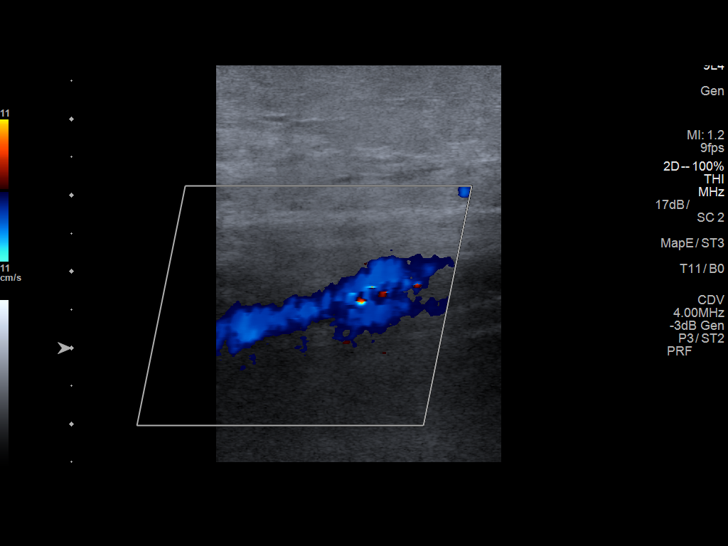
[im 84/92]
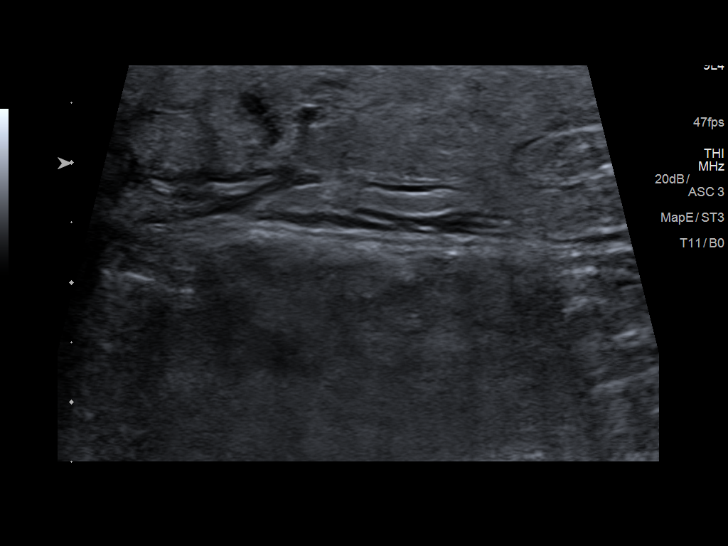
[im 92/92]
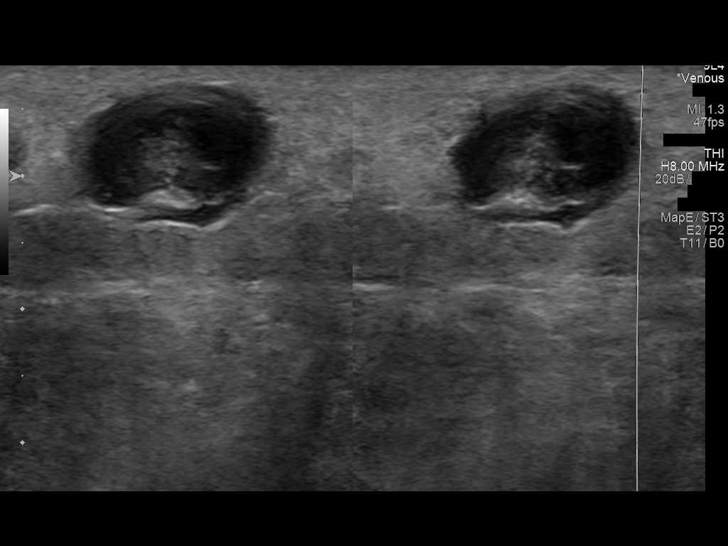

[13 of 24 positions shown; findings below may reference images not displayed]

FINDINGS: Contralateral Common Femoral Vein: Respiratory phasicity is normal
and symmetric with the symptomatic side. No evidence of thrombus.
Normal compressibility.

Common Femoral Vein: No evidence of thrombus. Normal
compressibility, respiratory phasicity and response to augmentation.

Saphenofemoral Junction: No evidence of thrombus. Normal
compressibility and flow on color Doppler imaging.

Profunda Femoral Vein: No evidence of thrombus. Normal
compressibility and flow on color Doppler imaging.

Femoral Vein: No evidence of thrombus. Normal compressibility,
respiratory phasicity and response to augmentation.

Popliteal Vein: No evidence of thrombus. Normal compressibility,
respiratory phasicity and response to augmentation.

Calf Veins: No evidence of thrombus. Normal compressibility and flow
on color Doppler imaging.

Superficial Great Saphenous Vein: Left distal GSV in the calf region
demonstrates intraluminal thrombus and is noncompressible with
superficial thrombosis/thrombophlebitis. Calf varicosities are also
thrombosed. Associated calf edema present.

Venous Reflux:  None.

Other Findings:  None.
IMPRESSION: Negative for left lower extremity DVT.

Left distal GSV calf region superficial thrombosis/thrombophlebitis
with associated thrombosed varicosities.

## 2019-02-05 ENCOUNTER — Encounter (INDEPENDENT_AMBULATORY_CARE_PROVIDER_SITE_OTHER): Payer: Self-pay | Admitting: *Deleted

## 2020-06-18 ENCOUNTER — Telehealth: Payer: Self-pay | Admitting: Cardiology

## 2020-06-23 ENCOUNTER — Encounter: Payer: Self-pay | Admitting: Cardiology

## 2020-06-23 ENCOUNTER — Ambulatory Visit: Payer: Medicare Other | Admitting: Cardiology

## 2020-06-23 ENCOUNTER — Other Ambulatory Visit: Payer: Self-pay

## 2020-06-23 VITALS — BP 139/86 | HR 79 | Ht 68.0 in | Wt 217.0 lb

## 2020-06-23 DIAGNOSIS — R0609 Other forms of dyspnea: Secondary | ICD-10-CM

## 2020-06-23 DIAGNOSIS — E1159 Type 2 diabetes mellitus with other circulatory complications: Secondary | ICD-10-CM

## 2020-06-23 DIAGNOSIS — I429 Cardiomyopathy, unspecified: Secondary | ICD-10-CM

## 2020-06-23 DIAGNOSIS — I4891 Unspecified atrial fibrillation: Secondary | ICD-10-CM

## 2020-06-23 DIAGNOSIS — I10 Essential (primary) hypertension: Secondary | ICD-10-CM

## 2020-06-23 DIAGNOSIS — I5043 Acute on chronic combined systolic (congestive) and diastolic (congestive) heart failure: Secondary | ICD-10-CM

## 2020-06-23 MED ORDER — ENTRESTO 49-51 MG PO TABS: 1.0000 | ORAL_TABLET | Freq: Two times a day (BID) | ORAL | 0 refills | Status: DC

## 2020-06-23 MED ORDER — ATORVASTATIN CALCIUM 40 MG PO TABS: 40.0000 mg | ORAL_TABLET | Freq: Every day | ORAL | 3 refills | Status: DC

## 2020-06-23 NOTE — H&P (View-Only) (Signed)
Date:  06/23/2020   ID:  Tim Williams, DOB 20-Jun-1950, MRN [id number]  PCP:  Monico Blitz, MD  Cardiologist:  Rex Kras, DO, [facility] (established care 06/23/2020) Former Cardiology Providers: Cathie Hoops, DO   REASON FOR CONSULT: CHF  REQUESTING PHYSICIAN:  Monico Blitz, MD [address] [place] [address]  Chief Complaint  Patient presents with  . Congestive Heart Failure    HPI  Tim Williams is a 70 y.o. male who presents to the office with a chief complaint of " shortness of breath." Patient's past medical history and cardiovascular risk factors include: Hyperlipidemia, atrial fibrillation, benign essential hypertension, acute on chronic HFrEF/Stage C/ NYHA class III, type 2 diabetes mellitus, active cigarette smoker, advanced age.  He is referred to the office at the request of Dione Housekeeper, MD for evaluation of congestive heart failure.  Patient is accompanied by his son Tim Williams will can be reached at [phone].  Shortness of breath: Patient states that he has been experiencing dyspnea on exertion with minimal effort.  Patient states that he could go up for steps or walk 0.1 miles up to his mailbox and become dyspneic on exertion.  He has been feeling like this for the last 6 to 12 months.  He also recently went to the hospital and treated at [facility] [place] records reviewed from care everywhere.  During that time he also had elevation in troponin most likely secondary to supply demand ischemia in the setting of severely reduced LVEF.  Patient was diuresed and started on oxygen therapy to discharge.  Patient states that since the utilization of oxygen he is less short of breath.  He uses 3 L per nasal cannula around-the-clock.  Despite using oxygen he continues to smoke 1.5 packs/day.  He verbalizes understanding that smoking in the presence of oxygen is a fire hazard but states that he stays 15 feet away.  Review of external records from [facility] notes that he has had severely  reduced LVEF since 2020.  He denies any ischemic evaluation.   Based on the outside records received from his PCP patient has a history of atrial fibrillation.  Currently on metoprolol for rate control.  He is not on oral anticoagulation due to cost.  We also discussed considering Coumadin as an alternative but states that he would like to discuss it further with his family prior to initiating oral anticoagulation.  He understands that given his risk factors aspirin is not protective enough against the risk of thromboembolic events.    ALLERGIES: Allergies  Allergen Reactions  . Ibuprofen     Pt can't take due to ulcers.  . Penicillins Other (See Comments)    "knocks out" Childhood allergy Has patient had a PCN reaction causing immediate rash, facial/tongue/throat swelling, SOB or lightheadedness with hypotension: Unknown Has patient had a PCN reaction causing severe rash involving mucus membranes or skin necrosis: Unknown Has patient had a PCN reaction that required hospitalization: No Has patient had a PCN reaction occurring within the last 10 years: No If all of the above answers are "NO", then may proceed with Cephalosporin use.      MEDICATION LIST PRIOR TO VISIT: Current Meds  Medication Sig  . amLODipine (NORVASC) 5 MG tablet Take 5 mg by mouth daily.  Marland Kitchen aspirin 325 MG tablet Take 1 tablet by mouth daily.  Marland Kitchen atorvastatin (LIPITOR) 40 MG tablet Take 1 tablet (40 mg total) by mouth daily.  . furosemide (LASIX) 40 MG tablet Take  40 mg by mouth in the morning.  Marland Kitchen glipiZIDE (GLUCOTROL) 10 MG tablet Take 10 mg by mouth daily before breakfast.   . metoprolol tartrate (LOPRESSOR) 25 MG tablet Take 25 mg by mouth 2 (two) times daily.  . potassium chloride (K-DUR) 10 MEQ tablet Take 10 mEq by mouth 2 (two) times daily.  . sacubitril-valsartan (ENTRESTO) 49-51 MG Take 1 tablet by mouth 2 (two) times daily.     PAST MEDICAL HISTORY: Past Medical History:  Diagnosis Date  . Arthritis    . Atrial fibrillation ([place])   . CHF (congestive heart failure) ([place])   . COPD (chronic obstructive pulmonary disease) ([place])   . Diabetes ([place])   . Gastric ulcer    2009  . Hypertension   . Shortness of breath   . Wound healing, delayed n   left upper arm    PAST SURGICAL HISTORY: Past Surgical History:  Procedure Laterality Date  . BACK SURGERY     lumbar discectomy  . BIOPSY  02/23/2018   Procedure: BIOPSY;  Surgeon: Rogene Houston, MD;  [facility];  Service: Endoscopy;;  colon  . COLONOSCOPY WITH PROPOFOL N/A 02/23/2018   Procedure: COLONOSCOPY WITH PROPOFOL;  Surgeon: Rogene Houston, MD;  Location: [facility];  Service: Endoscopy;  Laterality: N/A;  2:10  . CYSTOSCOPY     stone removal  . EYE SURGERY     muscle repair right eye-age 89  . HERNIA REPAIR     left age 24  . PARTIAL COLECTOMY N/A 04/02/2018   Procedure: PARTIAL COLECTOMY LOWER ANTERIOR RESECTIONS and biopsy of left arm wound;  Surgeon: Aviva Signs, MD;  Location: [facility] OR[facility];  Service: General;  Laterality: N/A;  . POLYPECTOMY  02/23/2018   Procedure: POLYPECTOMY;  Surgeon: Rogene Houston, MD;  Location: [facility];  Service: Endoscopy;;  colon  . TOTAL KNEE ARTHROPLASTY  05/17/2012   Procedure: TOTAL KNEE ARTHROPLASTY;  Surgeon: Sanjuana Kava, MD;  Location: [facility];  Service: Orthopedics;  Laterality: Right;  . TRANSURETHRAL RESECTION OF PROSTATE      FAMILY HISTORY: The patient family history includes Hypertension in his father and mother.  SOCIAL HISTORY:  The patient  reports that he has been smoking cigarettes. He has a 60.00 pack-year smoking history. He has never used smokeless tobacco. He reports that he does not drink alcohol and does not use drugs.  REVIEW OF SYSTEMS: Review of Systems  Constitutional: Negative for chills and fever.  HENT: Negative for hoarse voice and nosebleeds.   Eyes: Negative for discharge, double vision and pain.  Cardiovascular: Positive for dyspnea on  exertion and leg swelling. Negative for chest pain, claudication, near-syncope, orthopnea, palpitations, paroxysmal nocturnal dyspnea and syncope.  Respiratory: Negative for hemoptysis and shortness of breath.   Musculoskeletal: Negative for muscle cramps and myalgias.  Gastrointestinal: Negative for abdominal pain, constipation, diarrhea, hematemesis, hematochezia, melena, nausea and vomiting.  Neurological: Negative for dizziness and light-headedness.    PHYSICAL EXAM: Vitals with BMI 06/23/2020 06/23/2020 04/19/2018  Height - 5\' 8"  -  Weight - 217 lbs 194 lbs 6 oz  BMI - 33 [id number]  Systolic [id number]  Diastolic 86 [id number] 98  Pulse 79 72 82    CONSTITUTIONAL: Well-developed and well-nourished. No acute distress.  SKIN: Skin is warm and dry. No rash noted. No cyanosis. No pallor. No jaundice HEAD: Normocephalic and atraumatic.  EYES: No scleral icterus MOUTH/THROAT: Moist oral membranes.  NECK: No JVD present. No thyromegaly noted.  No carotid bruits  LYMPHATIC: No visible cervical adenopathy.  CHEST Normal respiratory effort. No intercostal retractions.  Equal rise and fall of the chest cavity. LUNGS: Decreased breath sounds bilaterally.  Expiratory wheezes noted.  Rales noted at the bases.  CARDIOVASCULAR: Irregularly irregular, variable [id number], holosystolic murmur heard at the apex, no gallops or rubs. ABDOMINAL: Obese, soft, nontender, nondistended, positive bowel sounds in all 4 quadrants, no apparent ascites.  EXTREMITIES: +2 bilateral peripheral edema up to mid shin, difficult to palpate dorsalis pedis and posterior tibial pulses secondary to edema HEMATOLOGIC: No significant bruising NEUROLOGIC: Oriented to person, place, and time. Nonfocal. Normal muscle tone.  PSYCHIATRIC: Normal mood and affect. Normal behavior. Cooperative  CARDIAC DATABASE: EKG: 06/23/2020: Atrial fibrillation, 91 bpm, left axis deviation, left anterior fascicular block, poor R wave progression, nonspecific T  wave abnormality, without underlying injury pattern.  Echocardiogram: 05/24/2019 [facility]:  1. Technically difficult study due to chest wall/lung interference and patient position.  2. The left ventricle is moderately dilated in size with mildly ncreased wall thickness.  3. Severely decreased left ventricular systolic function, ejection fraction 25%.  4. Dilated left atrium - moderately dilated.  5. Mitral regurgitation - moderate.  6. Tricuspid regurgitation - moderate.  7. Aortic sclerosis.  8. Elevated right atrial pressure.  Stress Testing: No results found for this or any previous visit from the past 1095 days.  Heart Catheterization: None  LABORATORY DATA: CBC Latest Ref Rng & Units 04/08/2018 04/05/2018 04/04/2018  WBC 4.0 - 10.5 K/uL 8.2 11.0(H) 12.8(H)  Hemoglobin 13.0 - 17.0 g/dL 12.7(L) 12.0(L) 12.0(L)  Hematocrit 39.0 - 52.0 % 40.0 36.4(L) 37.4(L)  Platelets 150 - 400 K/uL 272 212 198    CMP Latest Ref Rng & Units 04/08/2018 04/06/2018 04/05/2018  Glucose 70 - 99 mg/dL 122(H) 123(H) 104(H)  BUN 8 - 23 mg/dL 12 10 8   Creatinine 0.61 - 1.24 mg/dL 0.85 0.77 0.75  Sodium 135 - 145 mmol/L 135 137 136  Potassium 3.5 - 5.1 mmol/L 3.6 3.7 3.4(L)  Chloride 98 - 111 mmol/L 102 104 103  CO2 22 - 32 mmol/L 27 26 26   Calcium 8.9 - 10.3 mg/dL 8.5(L) 8.2(L) 8.3(L)  Total Protein 6.5 - 8.1 g/dL - - -  Total Bilirubin 0.3 - 1.2 mg/dL - - -  Alkaline Phos 38 - 126 U/L - - -  AST 15 - 41 U/L - - -  ALT 0 - 44 U/L - - -    Lipid Panel  No results found for: CHOL, TRIG, HDL, CHOLHDL, VLDL, LDLCALC, LDLDIRECT, LABVLDL  No components found for: NTPROBNP No results for input(s): PROBNP in the last 8760 hours. No results for input(s): TSH in the last 8760 hours.  BMP No results for input(s): NA, K, CL, CO2, GLUCOSE, BUN, CREATININE, CALCIUM, GFRNONAA, GFRAA in the last 8760 hours.  HEMOGLOBIN A1C Lab Results  Component Value Date   HGBA1C 6.7 (H) 04/02/2018   MPG  146 04/02/2018    External Labs 06/11/2020: [facility] I peak 232 proBNP 221. Sodium 143, potassium 3.5, chloride 101, bicarb 31, BUN 20, creatinine 1, AST 20, ALT 19, alkaline phosphatase 51   IMPRESSION:    ICD-10-CM   1. Acute on chronic combined systolic and diastolic congestive heart failure (HCC)  I50.43 EKG 12-Lead    sacubitril-valsartan (ENTRESTO) 49-51 MG    Basic metabolic panel    Magnesium    CBC    Pro b natriuretic peptide (BNP)  2. Dyspnea on exertion  R06.09   3. Benign hypertension  I10   4. Atrial fibrillation, unspecified type ([place])  I48.91   5. Type 2 diabetes mellitus with other circulatory complication, without long-term current use of insulin (HCC)  E11.59 atorvastatin (LIPITOR) 40 MG tablet    Lipid Panel With LDL/HDL Ratio    LDL cholesterol, direct     RECOMMENDATIONS: Tim Williams is a 70 y.o. male whose past medical history and cardiac risk factors include:  Hyperlipidemia, atrial fibrillation, benign essential hypertension, acute on chronic HFrEF/Stage C/ NYHA class III, type 2 diabetes mellitus, active cigarette smoker, advanced age.  Cardiomyopathy, unspecified.  Patient had an echocardiogram in 2020 at [facility] available in care everywhere which notes severely reduced left ventricular systolic function.  Since then he has not undergone an ischemic evaluation to rule out obstructive CAD.  Given the acute on chronic exacerbation of CHF, recent hospitalization, continued dyspnea on exertion ischemic evaluation is appropriate.  Discussed undergoing left heart catheterization with possible intervention with both the patient and his son Tim Williams during today's office visit and both are in agreement after discussing, the risks, benefits, and alternatives.  The procedure of left heart catheterization with possible intervention was explained to the patient in detail.   The indication, alternatives, risks and benefits were reviewed.    Complications include but not limited to bleeding, infection, vascular injury, stroke, myocardial infection, arrhythmia, kidney injury, radiation-related injury in the case of prolonged fluoroscopy use, emergency cardiac surgery, and death. The patient understands the risks of serious complication is 1-2 in 0258 with diagnostic cardiac cath and 1-2% or less with angioplasty/stenting.   I have discussed in particular detail the possibility of acute renal failure after coronary angiography particularly if PCI is necessary.  I have described that it is possible patient may need short-term dialysis and the less likely may also require long-term dialysis depending on renal recovery.  I have asked for consent to proceed with coronary angiography understanding the risks of potentially needing dialysis, as well as the risks as stated above.    The patient and his son Tim Williams voices understanding and provides verbal feedback and wishes to proceed with coronary angiography with possible PCI.  Acute on chronic exacerbation of combined systolic and diastolic congestive heart failure, stage C, NYHA class III:  Patient is not on guideline directed medical therapy.  Would recommend stepwise approach and increasing his medications as hemodynamics and laboratory values allow.  Initiate Entresto 49/51 mg p.o. twice daily and decrease Lasix to 40 mg p.o. every morning  Check blood work in 1 week to evaluate kidney function and electrolytes.  Continue beta-blocker therapy.  Atrial fibrillation, chronicity unknown  Rate control: Metoprolol.  Rhythm control: N/A  Thromboembolic prophylaxis: Aspirin  CHA2DS2-VASc SCORE is 4 which correlates to 4 % risk of stroke per year (age, diabetes, hypertension, congestive heart failure).  Patient educated that aspirin is not strong enough to prevent thromboembolic events.  We discussed oral anticoagulation options including Coumadin and NOAC.  Patient states that in the  past he was prescribed NOAC but because of it being cost prohibitive he chose to be on aspirin.  I informed both him and his son that Coumadin is an alternative.  They would like to discuss it further with family prior to make that decision.  Patient is asked to discuss this further with his PCP as well.  Benign essential hypertension: Continue current medical therapy.  Currently managed by primary care provider.  Non-insulin-dependent diabetes mellitus type 2  with circulatory complications:  Education the importance of glycemic control.  Will uptitrate guideline directed medical therapy in a stepwise fashion depending on how he responds to therapy and his compliance with follow-up.  Patient is not on statin therapy despite being a diabetic.  Check fasting lipid profile and will start atorvastatin 40 mg p.o. nightly thereafter.  Patient is asked to bring his cardiac medications in at the next office visit.  Total time spent 60 minutes discussing disease management with both the patient and his son for acute exacerbation of congestive heart failure, cardiomyopathy management, atrial fibrillation management.  Reviewing outside records provided by PCP and also available in care everywhere.  Obtaining consent for left heart catheterization as discussed above.  Coordination of care.  FINAL MEDICATION LIST END OF ENCOUNTER: Meds ordered this encounter  Medications  . atorvastatin (LIPITOR) 40 MG tablet    Sig: Take 1 tablet (40 mg total) by mouth daily.    Dispense:  90 tablet    Refill:  3  . sacubitril-valsartan (ENTRESTO) 49-51 MG    Sig: Take 1 tablet by mouth 2 (two) times daily.    Dispense:  180 tablet    Refill:  0    Current Outpatient Medications:  .  amLODipine (NORVASC) 5 MG tablet, Take 5 mg by mouth daily., Disp: , Rfl:  .  aspirin 325 MG tablet, Take 1 tablet by mouth daily., Disp: , Rfl:  .  atorvastatin (LIPITOR) 40 MG tablet, Take 1 tablet (40 mg total) by mouth daily.,  Disp: 90 tablet, Rfl: 3 .  furosemide (LASIX) 40 MG tablet, Take 40 mg by mouth in the morning., Disp: , Rfl:  .  glipiZIDE (GLUCOTROL) 10 MG tablet, Take 10 mg by mouth daily before breakfast. , Disp: , Rfl:  .  metoprolol tartrate (LOPRESSOR) 25 MG tablet, Take 25 mg by mouth 2 (two) times daily., Disp: , Rfl:  .  potassium chloride (K-DUR) 10 MEQ tablet, Take 10 mEq by mouth 2 (two) times daily., Disp: , Rfl:  .  sacubitril-valsartan (ENTRESTO) 49-51 MG, Take 1 tablet by mouth 2 (two) times daily., Disp: 180 tablet, Rfl: 0  Orders Placed This Encounter  Procedures  . Lipid Panel With LDL/HDL Ratio  . LDL cholesterol, direct  . Basic metabolic panel  . Magnesium  . CBC  . Pro b natriuretic peptide (BNP)  . EKG 12-Lead    There are no Patient Instructions on file for this visit.   --Continue cardiac medications as reconciled in final medication list. --Return in about 24 days (around 07/17/2020) for heart failure management., Post heart catheterization, A. fib. Or sooner if needed. --Continue follow-up with your primary care physician regarding the management of your other chronic comorbid conditions.  Patient's questions and concerns were addressed to his satisfaction. He voices understanding of the instructions provided during this encounter.   This note was created using a voice recognition software as a result there may be grammatical errors inadvertently enclosed that do not reflect the nature of this encounter. Every attempt is made to correct such errors.  Rex Kras, [place], [facility]  Pager: [phone] Office: [phone]

## 2020-06-23 NOTE — Progress Notes (Signed)
Date:  06/23/2020   ID:  Tim Williams, DOB 03/12/1951, MRN [id number]  PCP:  Monico Blitz, MD  Cardiologist:  Rex Kras, DO, [facility] (established care 06/23/2020) Former Cardiology Providers: Cathie Hoops, DO   REASON FOR CONSULT: CHF  REQUESTING PHYSICIAN:  Monico Blitz, MD [address] [place] [address]  Chief Complaint  Patient presents with  . Congestive Heart Failure    HPI  Tim Williams is a 70 y.o. male who presents to the office with a chief complaint of " shortness of breath." Patient's past medical history and cardiovascular risk factors include: Hyperlipidemia, atrial fibrillation, benign essential hypertension, acute on chronic HFrEF/Stage C/ NYHA class III, type 2 diabetes mellitus, active cigarette smoker, advanced age.  He is referred to the office at the request of Dione Housekeeper, MD for evaluation of congestive heart failure.  Patient is accompanied by his son Tim Williams will can be reached at [phone].  Shortness of breath: Patient states that he has been experiencing dyspnea on exertion with minimal effort.  Patient states that he could go up for steps or walk 0.1 miles up to his mailbox and become dyspneic on exertion.  He has been feeling like this for the last 6 to 12 months.  He also recently went to the hospital and treated at [facility] records reviewed from care everywhere.  During that time he also had elevation in troponin most likely secondary to supply demand ischemia in the setting of severely reduced LVEF.  Patient was diuresed and started on oxygen therapy to discharge.  Patient states that since the utilization of oxygen he is less short of breath.  He uses 3 L per nasal cannula around-the-clock.  Despite using oxygen he continues to smoke 1.5 packs/day.  He verbalizes understanding that smoking in the presence of oxygen is a fire hazard but states that he stays 15 feet away.  Review of external records from [facility] notes that he has had severely  reduced LVEF since 2020.  He denies any ischemic evaluation.   Based on the outside records received from his PCP patient has a history of atrial fibrillation.  Currently on metoprolol for rate control.  He is not on oral anticoagulation due to cost.  We also discussed considering Coumadin as an alternative but states that he would like to discuss it further with his family prior to initiating oral anticoagulation.  He understands that given his risk factors aspirin is not protective enough against the risk of thromboembolic events.    ALLERGIES: Allergies  Allergen Reactions  . Ibuprofen     Pt can't take due to ulcers.  . Penicillins Other (See Comments)    "knocks out" Childhood allergy Has patient had a PCN reaction causing immediate rash, facial/tongue/throat swelling, SOB or lightheadedness with hypotension: Unknown Has patient had a PCN reaction causing severe rash involving mucus membranes or skin necrosis: Unknown Has patient had a PCN reaction that required hospitalization: No Has patient had a PCN reaction occurring within the last 10 years: No If all of the above answers are "NO", then may proceed with Cephalosporin use.      MEDICATION LIST PRIOR TO VISIT: Current Meds  Medication Sig  . amLODipine (NORVASC) 5 MG tablet Take 5 mg by mouth daily.  Marland Kitchen aspirin 325 MG tablet Take 1 tablet by mouth daily.  Marland Kitchen atorvastatin (LIPITOR) 40 MG tablet Take 1 tablet (40 mg total) by mouth daily.  . furosemide (LASIX) 40 MG tablet Take  40 mg by mouth in the morning.  Marland Kitchen glipiZIDE (GLUCOTROL) 10 MG tablet Take 10 mg by mouth daily before breakfast.   . metoprolol tartrate (LOPRESSOR) 25 MG tablet Take 25 mg by mouth 2 (two) times daily.  . potassium chloride (K-DUR) 10 MEQ tablet Take 10 mEq by mouth 2 (two) times daily.  . sacubitril-valsartan (ENTRESTO) 49-51 MG Take 1 tablet by mouth 2 (two) times daily.     PAST MEDICAL HISTORY: Past Medical History:  Diagnosis Date  . Arthritis    . Atrial fibrillation ([place])   . CHF (congestive heart failure) ([place])   . COPD (chronic obstructive pulmonary disease) ([place])   . Diabetes ([place])   . Gastric ulcer    2009  . Hypertension   . Shortness of breath   . Wound healing, delayed n   left upper arm    PAST SURGICAL HISTORY: Past Surgical History:  Procedure Laterality Date  . BACK SURGERY     lumbar discectomy  . BIOPSY  02/23/2018   Procedure: BIOPSY;  Surgeon: Rogene Houston, MD;  Location: [facility];  Service: Endoscopy;;  colon  . COLONOSCOPY WITH PROPOFOL N/A 02/23/2018   Procedure: COLONOSCOPY WITH PROPOFOL;  Surgeon: Rogene Houston, MD;  Location: [facility];  Service: Endoscopy;  Laterality: N/A;  2:10  . CYSTOSCOPY     stone removal  . EYE SURGERY     muscle repair right eye-age 86  . HERNIA REPAIR     left age 21  . PARTIAL COLECTOMY N/A 04/02/2018   Procedure: PARTIAL COLECTOMY LOWER ANTERIOR RESECTIONS and biopsy of left arm wound;  Surgeon: Aviva Signs, MD;  Location: [facility] ORS;  Service: General;  Laterality: N/A;  . POLYPECTOMY  02/23/2018   Procedure: POLYPECTOMY;  Surgeon: Rogene Houston, MD;  Location: [facility];  Service: Endoscopy;;  colon  . TOTAL KNEE ARTHROPLASTY  05/17/2012   Procedure: TOTAL KNEE ARTHROPLASTY;  Surgeon: Sanjuana Kava, MD;  Location: [facility];  Service: Orthopedics;  Laterality: Right;  . TRANSURETHRAL RESECTION OF PROSTATE      FAMILY HISTORY: The patient family history includes Hypertension in his father and mother.  SOCIAL HISTORY:  The patient  reports that he has been smoking cigarettes. He has a 60.00 pack-year smoking history. He has never used smokeless tobacco. He reports that he does not drink alcohol and does not use drugs.  REVIEW OF SYSTEMS: Review of Systems  Constitutional: Negative for chills and fever.  HENT: Negative for hoarse voice and nosebleeds.   Eyes: Negative for discharge, double vision and pain.  Cardiovascular: Positive for dyspnea on  exertion and leg swelling. Negative for chest pain, claudication, near-syncope, orthopnea, palpitations, paroxysmal nocturnal dyspnea and syncope.  Respiratory: Negative for hemoptysis and shortness of breath.   Musculoskeletal: Negative for muscle cramps and myalgias.  Gastrointestinal: Negative for abdominal pain, constipation, diarrhea, hematemesis, hematochezia, melena, nausea and vomiting.  Neurological: Negative for dizziness and light-headedness.    PHYSICAL EXAM: Vitals with BMI 06/23/2020 06/23/2020 04/19/2018  Height - 5\' 8"  -  Weight - 217 lbs 194 lbs 6 oz  BMI - 33 [id number]  Systolic [id number] [id number]  Diastolic 86 [id number] 98  Pulse 79 72 82    CONSTITUTIONAL: Well-developed and well-nourished. No acute distress.  SKIN: Skin is warm and dry. No rash noted. No cyanosis. No pallor. No jaundice HEAD: Normocephalic and atraumatic.  EYES: No scleral icterus MOUTH/THROAT: Moist oral membranes.  NECK: No JVD present. No thyromegaly noted.  No carotid bruits  LYMPHATIC: No visible cervical adenopathy.  CHEST Normal respiratory effort. No intercostal retractions.  Equal rise and fall of the chest cavity. LUNGS: Decreased breath sounds bilaterally.  Expiratory wheezes noted.  Rales noted at the bases.  CARDIOVASCULAR: Irregularly irregular, variable [id number], holosystolic murmur heard at the apex, no gallops or rubs. ABDOMINAL: Obese, soft, nontender, nondistended, positive bowel sounds in all 4 quadrants, no apparent ascites.  EXTREMITIES: +2 bilateral peripheral edema up to mid shin, difficult to palpate dorsalis pedis and posterior tibial pulses secondary to edema HEMATOLOGIC: No significant bruising NEUROLOGIC: Oriented to person, place, and time. Nonfocal. Normal muscle tone.  PSYCHIATRIC: Normal mood and affect. Normal behavior. Cooperative  CARDIAC DATABASE: EKG: 06/23/2020: Atrial fibrillation, 91 bpm, left axis deviation, left anterior fascicular block, poor R wave progression, nonspecific T  wave abnormality, without underlying injury pattern.  Echocardiogram: 05/24/2019 [facility]:  1. Technically difficult study due to chest wall/lung interference and patient position.  2. The left ventricle is moderately dilated in size with mildly ncreased wall thickness.  3. Severely decreased left ventricular systolic function, ejection fraction 25%.  4. Dilated left atrium - moderately dilated.  5. Mitral regurgitation - moderate.  6. Tricuspid regurgitation - moderate.  7. Aortic sclerosis.  8. Elevated right atrial pressure.  Stress Testing: No results found for this or any previous visit from the past 1095 days.  Heart Catheterization: None  LABORATORY DATA: CBC Latest Ref Rng & Units 04/08/2018 04/05/2018 04/04/2018  WBC 4.0 - 10.5 K/uL 8.2 11.0(H) 12.8(H)  Hemoglobin 13.0 - 17.0 g/dL 12.7(L) 12.0(L) 12.0(L)  Hematocrit 39.0 - 52.0 % 40.0 36.4(L) 37.4(L)  Platelets 150 - 400 K/uL 272 212 198    CMP Latest Ref Rng & Units 04/08/2018 04/06/2018 04/05/2018  Glucose 70 - 99 mg/dL 122(H) 123(H) 104(H)  BUN 8 - 23 mg/dL 12 10 8   Creatinine 0.61 - 1.24 mg/dL 0.85 0.77 0.75  Sodium 135 - 145 mmol/L 135 137 136  Potassium 3.5 - 5.1 mmol/L 3.6 3.7 3.4(L)  Chloride 98 - 111 mmol/L 102 104 103  CO2 22 - 32 mmol/L 27 26 26   Calcium 8.9 - 10.3 mg/dL 8.5(L) 8.2(L) 8.3(L)  Total Protein 6.5 - 8.1 g/dL - - -  Total Bilirubin 0.3 - 1.2 mg/dL - - -  Alkaline Phos 38 - 126 U/L - - -  AST 15 - 41 U/L - - -  ALT 0 - 44 U/L - - -    Lipid Panel  No results found for: CHOL, TRIG, HDL, CHOLHDL, VLDL, LDLCALC, LDLDIRECT, LABVLDL  No components found for: NTPROBNP No results for input(s): PROBNP in the last 8760 hours. No results for input(s): TSH in the last 8760 hours.  BMP No results for input(s): NA, K, CL, CO2, GLUCOSE, BUN, CREATININE, CALCIUM, GFRNONAA, GFRAA in the last 8760 hours.  HEMOGLOBIN A1C Lab Results  Component Value Date   HGBA1C 6.7 (H) 04/02/2018   MPG  146 04/02/2018    External Labs 06/11/2020: [facility] I peak 232 proBNP 221. Sodium 143, potassium 3.5, chloride 101, bicarb 31, BUN 20, creatinine 1, AST 20, ALT 19, alkaline phosphatase 51   IMPRESSION:    ICD-10-CM   1. Acute on chronic combined systolic and diastolic congestive heart failure (HCC)  I50.43 EKG 12-Lead    sacubitril-valsartan (ENTRESTO) 49-51 MG    Basic metabolic panel    Magnesium    CBC    Pro b natriuretic peptide (BNP)  2. Dyspnea on exertion  R06.09   3. Benign hypertension  I10   4. Atrial fibrillation, unspecified type ([place])  I48.91   5. Type 2 diabetes mellitus with other circulatory complication, without long-term current use of insulin (HCC)  E11.59 atorvastatin (LIPITOR) 40 MG tablet    Lipid Panel With LDL/HDL Ratio    LDL cholesterol, direct     RECOMMENDATIONS: Tim Williams is a 70 y.o. male whose past medical history and cardiac risk factors include:  Hyperlipidemia, atrial fibrillation, benign essential hypertension, acute on chronic HFrEF/Stage C/ NYHA class III, type 2 diabetes mellitus, active cigarette smoker, advanced age.  Cardiomyopathy, unspecified.  Patient had an echocardiogram in 2020 at [facility] available in care everywhere which notes severely reduced left ventricular systolic function.  Since then he has not undergone an ischemic evaluation to rule out obstructive CAD.  Given the acute on chronic exacerbation of CHF, recent hospitalization, continued dyspnea on exertion ischemic evaluation is appropriate.  Discussed undergoing left heart catheterization with possible intervention with both the patient and his son Jishnu Jenniges during today's office visit and both are in agreement after discussing, the risks, benefits, and alternatives.  The procedure of left heart catheterization with possible intervention was explained to the patient in detail.   The indication, alternatives, risks and benefits were reviewed.    Complications include but not limited to bleeding, infection, vascular injury, stroke, myocardial infection, arrhythmia, kidney injury, radiation-related injury in the case of prolonged fluoroscopy use, emergency cardiac surgery, and death. The patient understands the risks of serious complication is 1-2 in 0258 with diagnostic cardiac cath and 1-2% or less with angioplasty/stenting.   I have discussed in particular detail the possibility of acute renal failure after coronary angiography particularly if PCI is necessary.  I have described that it is possible patient may need short-term dialysis and the less likely may also require long-term dialysis depending on renal recovery.  I have asked for consent to proceed with coronary angiography understanding the risks of potentially needing dialysis, as well as the risks as stated above.    The patient and his son Gerald Stabs voices understanding and provides verbal feedback and wishes to proceed with coronary angiography with possible PCI.  Acute on chronic exacerbation of combined systolic and diastolic congestive heart failure, stage C, NYHA class III:  Patient is not on guideline directed medical therapy.  Would recommend stepwise approach and increasing his medications as hemodynamics and laboratory values allow.  Initiate Entresto 49/51 mg p.o. twice daily and decrease Lasix to 40 mg p.o. every morning  Check blood work in 1 week to evaluate kidney function and electrolytes.  Continue beta-blocker therapy.  Atrial fibrillation, chronicity unknown  Rate control: Metoprolol.  Rhythm control: N/A  Thromboembolic prophylaxis: Aspirin  CHA2DS2-VASc SCORE is 4 which correlates to 4 % risk of stroke per year (age, diabetes, hypertension, congestive heart failure).  Patient educated that aspirin is not strong enough to prevent thromboembolic events.  We discussed oral anticoagulation options including Coumadin and NOAC.  Patient states that in the  past he was prescribed NOAC but because of it being cost prohibitive he chose to be on aspirin.  I informed both him and his son that Coumadin is an alternative.  They would like to discuss it further with family prior to make that decision.  Patient is asked to discuss this further with his PCP as well.  Benign essential hypertension: Continue current medical therapy.  Currently managed by primary care provider.  Non-insulin-dependent diabetes mellitus type 2  with circulatory complications:  Education the importance of glycemic control.  Will uptitrate guideline directed medical therapy in a stepwise fashion depending on how he responds to therapy and his compliance with follow-up.  Patient is not on statin therapy despite being a diabetic.  Check fasting lipid profile and will start atorvastatin 40 mg p.o. nightly thereafter.  Patient is asked to bring his cardiac medications in at the next office visit.  Total time spent 60 minutes discussing disease management with both the patient and his son for acute exacerbation of congestive heart failure, cardiomyopathy management, atrial fibrillation management.  Reviewing outside records provided by PCP and also available in care everywhere.  Obtaining consent for left heart catheterization as discussed above.  Coordination of care.  FINAL MEDICATION LIST END OF ENCOUNTER: Meds ordered this encounter  Medications  . atorvastatin (LIPITOR) 40 MG tablet    Sig: Take 1 tablet (40 mg total) by mouth daily.    Dispense:  90 tablet    Refill:  3  . sacubitril-valsartan (ENTRESTO) 49-51 MG    Sig: Take 1 tablet by mouth 2 (two) times daily.    Dispense:  180 tablet    Refill:  0    Current Outpatient Medications:  .  amLODipine (NORVASC) 5 MG tablet, Take 5 mg by mouth daily., Disp: , Rfl:  .  aspirin 325 MG tablet, Take 1 tablet by mouth daily., Disp: , Rfl:  .  atorvastatin (LIPITOR) 40 MG tablet, Take 1 tablet (40 mg total) by mouth daily.,  Disp: 90 tablet, Rfl: 3 .  furosemide (LASIX) 40 MG tablet, Take 40 mg by mouth in the morning., Disp: , Rfl:  .  glipiZIDE (GLUCOTROL) 10 MG tablet, Take 10 mg by mouth daily before breakfast. , Disp: , Rfl:  .  metoprolol tartrate (LOPRESSOR) 25 MG tablet, Take 25 mg by mouth 2 (two) times daily., Disp: , Rfl:  .  potassium chloride (K-DUR) 10 MEQ tablet, Take 10 mEq by mouth 2 (two) times daily., Disp: , Rfl:  .  sacubitril-valsartan (ENTRESTO) 49-51 MG, Take 1 tablet by mouth 2 (two) times daily., Disp: 180 tablet, Rfl: 0  Orders Placed This Encounter  Procedures  . Lipid Panel With LDL/HDL Ratio  . LDL cholesterol, direct  . Basic metabolic panel  . Magnesium  . CBC  . Pro b natriuretic peptide (BNP)  . EKG 12-Lead    There are no Patient Instructions on file for this visit.   --Continue cardiac medications as reconciled in final medication list. --Return in about 24 days (around 07/17/2020) for heart failure management., Post heart catheterization, A. fib. Or sooner if needed. --Continue follow-up with your primary care physician regarding the management of your other chronic comorbid conditions.  Patient's questions and concerns were addressed to his satisfaction. He voices understanding of the instructions provided during this encounter.   This note was created using a voice recognition software as a result there may be grammatical errors inadvertently enclosed that do not reflect the nature of this encounter. Every attempt is made to correct such errors.  Rex Kras, [place], [facility]  Pager: [phone] Office: [phone]

## 2020-06-24 ENCOUNTER — Other Ambulatory Visit: Payer: Self-pay | Admitting: Cardiology

## 2020-06-24 DIAGNOSIS — I5043 Acute on chronic combined systolic (congestive) and diastolic (congestive) heart failure: Secondary | ICD-10-CM

## 2020-06-24 DIAGNOSIS — E1159 Type 2 diabetes mellitus with other circulatory complications: Secondary | ICD-10-CM

## 2020-06-26 ENCOUNTER — Other Ambulatory Visit (HOSPITAL_COMMUNITY)
Admission: RE | Admit: 2020-06-26 | Discharge: 2020-06-26 | Disposition: A | Discharge status: Home / Self Care | Payer: Medicare Other | Source: Ambulatory Visit | Attending: Cardiology | Admitting: Cardiology

## 2020-06-26 ENCOUNTER — Other Ambulatory Visit: Payer: Self-pay

## 2020-06-26 DIAGNOSIS — Z01812 Encounter for preprocedural laboratory examination: Secondary | ICD-10-CM | POA: Insufficient documentation

## 2020-06-26 DIAGNOSIS — Z20822 Contact with and (suspected) exposure to covid-19: Secondary | ICD-10-CM | POA: Insufficient documentation

## 2020-06-26 NOTE — Telephone Encounter (Signed)
Patient scheduled for consultation.

## 2020-06-27 LAB — CBC
Hematocrit: 52.4 % — ABNORMAL HIGH (ref 37.5–51.0)
Hemoglobin: 18 g/dL — ABNORMAL HIGH (ref 13.0–17.7)
MCH: 34 pg — ABNORMAL HIGH (ref 26.6–33.0)
MCHC: 34.4 g/dL (ref 31.5–35.7)
MCV: 99 fL — ABNORMAL HIGH (ref 79–97)
Platelets: 216 10*3/uL (ref 150–450)
RBC: 5.29 x10E6/uL (ref 4.14–5.80)
RDW: 11.8 % (ref 11.6–15.4)
WBC: 7.8 10*3/uL (ref 3.4–10.8)

## 2020-06-27 LAB — BASIC METABOLIC PANEL
BUN/Creatinine Ratio: 16 (ref 10–24)
BUN: 17 mg/dL (ref 8–27)
CO2: 33 mmol/L — ABNORMAL HIGH (ref 20–29)
Calcium: 9.3 mg/dL (ref 8.6–10.2)
Chloride: 97 mmol/L (ref 96–106)
Creatinine, Ser: 1.09 mg/dL (ref 0.76–1.27)
GFR calc Af Amer: 81 mL/min/{1.73_m2} (ref >59)
GFR calc non Af Amer: 70 mL/min/{1.73_m2} (ref >59)
Glucose: 101 mg/dL — ABNORMAL HIGH (ref 65–99)
Potassium: 3.6 mmol/L (ref 3.5–5.2)
Sodium: 143 mmol/L (ref 134–144)

## 2020-06-27 LAB — PRO B NATRIURETIC PEPTIDE: NT-Pro BNP: 1881 pg/mL — ABNORMAL HIGH (ref 0–376)

## 2020-06-27 LAB — LIPID PANEL WITH LDL/HDL RATIO
Cholesterol, Total: 197 mg/dL (ref 100–199)
HDL: 45 mg/dL (ref >39)
LDL Chol Calc (NIH): 128 mg/dL — ABNORMAL HIGH (ref 0–99)
LDL/HDL Ratio: 2.8 ratio (ref 0.0–3.6)
Triglycerides: 136 mg/dL (ref 0–149)
VLDL Cholesterol Cal: 24 mg/dL (ref 5–40)

## 2020-06-27 LAB — LDL CHOLESTEROL, DIRECT: LDL Direct: 129 mg/dL — ABNORMAL HIGH (ref 0–99)

## 2020-06-27 LAB — MAGNESIUM: Magnesium: 2.3 mg/dL (ref 1.6–2.3)

## 2020-06-27 LAB — SARS CORONAVIRUS 2 (TAT 6-24 HRS): SARS Coronavirus 2: NEGATIVE

## 2020-06-30 ENCOUNTER — Ambulatory Visit (HOSPITAL_COMMUNITY)
Admission: RE | Admit: 2020-06-30 | Discharge: 2020-06-30 | Disposition: A | Discharge status: Home / Self Care | Payer: Medicare Other | Attending: Cardiology | Admitting: Cardiology

## 2020-06-30 ENCOUNTER — Encounter (HOSPITAL_COMMUNITY): Payer: Self-pay | Admitting: Cardiology

## 2020-06-30 ENCOUNTER — Encounter (HOSPITAL_COMMUNITY)
Admission: RE | Disposition: A | Discharge status: Home / Self Care | Payer: Self-pay | Source: Home / Self Care | Attending: Cardiology

## 2020-06-30 DIAGNOSIS — I5021 Acute systolic (congestive) heart failure: Secondary | ICD-10-CM | POA: Diagnosis present

## 2020-06-30 DIAGNOSIS — E1159 Type 2 diabetes mellitus with other circulatory complications: Secondary | ICD-10-CM | POA: Insufficient documentation

## 2020-06-30 DIAGNOSIS — Z880 Allergy status to penicillin: Secondary | ICD-10-CM | POA: Insufficient documentation

## 2020-06-30 DIAGNOSIS — Z7984 Long term (current) use of oral hypoglycemic drugs: Secondary | ICD-10-CM | POA: Insufficient documentation

## 2020-06-30 DIAGNOSIS — I428 Other cardiomyopathies: Secondary | ICD-10-CM | POA: Insufficient documentation

## 2020-06-30 DIAGNOSIS — E785 Hyperlipidemia, unspecified: Secondary | ICD-10-CM | POA: Diagnosis not present

## 2020-06-30 DIAGNOSIS — I110 Hypertensive heart disease with heart failure: Secondary | ICD-10-CM | POA: Diagnosis present

## 2020-06-30 DIAGNOSIS — R0609 Other forms of dyspnea: Secondary | ICD-10-CM | POA: Diagnosis not present

## 2020-06-30 DIAGNOSIS — Z7982 Long term (current) use of aspirin: Secondary | ICD-10-CM | POA: Insufficient documentation

## 2020-06-30 DIAGNOSIS — I5043 Acute on chronic combined systolic (congestive) and diastolic (congestive) heart failure: Secondary | ICD-10-CM | POA: Diagnosis not present

## 2020-06-30 DIAGNOSIS — Z79899 Other long term (current) drug therapy: Secondary | ICD-10-CM | POA: Diagnosis not present

## 2020-06-30 DIAGNOSIS — F17210 Nicotine dependence, cigarettes, uncomplicated: Secondary | ICD-10-CM | POA: Insufficient documentation

## 2020-06-30 DIAGNOSIS — R0600 Dyspnea, unspecified: Secondary | ICD-10-CM | POA: Diagnosis present

## 2020-06-30 DIAGNOSIS — I4819 Other persistent atrial fibrillation: Secondary | ICD-10-CM | POA: Insufficient documentation

## 2020-06-30 DIAGNOSIS — I2510 Atherosclerotic heart disease of native coronary artery without angina pectoris: Secondary | ICD-10-CM | POA: Diagnosis not present

## 2020-06-30 HISTORY — PX: RIGHT/LEFT HEART CATH AND CORONARY ANGIOGRAPHY: CATH118266

## 2020-06-30 LAB — POCT I-STAT 7, (LYTES, BLD GAS, ICA,H+H)
Acid-Base Excess: 6 mmol/L — ABNORMAL HIGH (ref 0.0–2.0)
Bicarbonate: 36 mmol/L — ABNORMAL HIGH (ref 20.0–28.0)
Calcium, Ion: 1.16 mmol/L (ref 1.15–1.40)
HCT: 49 % (ref 39.0–52.0)
Hemoglobin: 16.7 g/dL (ref 13.0–17.0)
O2 Saturation: 96 %
Potassium: 3.6 mmol/L (ref 3.5–5.1)
Sodium: 143 mmol/L (ref 135–145)
TCO2: 38 mmol/L — ABNORMAL HIGH (ref 22–32)
pCO2 arterial: 70.9 mmHg (ref 32.0–48.0)
pH, Arterial: 7.313 — ABNORMAL LOW (ref 7.350–7.450)
pO2, Arterial: 96 mmHg (ref 83.0–108.0)

## 2020-06-30 LAB — POCT I-STAT EG7
Acid-Base Excess: 4 mmol/L — ABNORMAL HIGH (ref 0.0–2.0)
Acid-Base Excess: 4 mmol/L — ABNORMAL HIGH (ref 0.0–2.0)
Bicarbonate: 34.1 mmol/L — ABNORMAL HIGH (ref 20.0–28.0)
Bicarbonate: 34.2 mmol/L — ABNORMAL HIGH (ref 20.0–28.0)
Calcium, Ion: 1.04 mmol/L — ABNORMAL LOW (ref 1.15–1.40)
Calcium, Ion: 1.19 mmol/L (ref 1.15–1.40)
HCT: 48 % (ref 39.0–52.0)
HCT: 48 % (ref 39.0–52.0)
Hemoglobin: 16.3 g/dL (ref 13.0–17.0)
Hemoglobin: 16.3 g/dL (ref 13.0–17.0)
O2 Saturation: 60 %
O2 Saturation: 69 %
Potassium: 3.5 mmol/L (ref 3.5–5.1)
Potassium: 3.5 mmol/L (ref 3.5–5.1)
Sodium: 140 mmol/L (ref 135–145)
Sodium: 141 mmol/L (ref 135–145)
TCO2: 36 mmol/L — ABNORMAL HIGH (ref 22–32)
TCO2: 36 mmol/L — ABNORMAL HIGH (ref 22–32)
pCO2, Ven: 71.2 mmHg (ref 44.0–60.0)
pCO2, Ven: 72.4 mmHg (ref 44.0–60.0)
pH, Ven: 7.282 (ref 7.250–7.430)
pH, Ven: 7.288 (ref 7.250–7.430)
pO2, Ven: 36 mmHg (ref 32.0–45.0)
pO2, Ven: 42 mmHg (ref 32.0–45.0)

## 2020-06-30 LAB — GLUCOSE, CAPILLARY: Glucose-Capillary: 139 mg/dL — ABNORMAL HIGH (ref 70–99)

## 2020-06-30 SURGERY — RIGHT/LEFT HEART CATH AND CORONARY ANGIOGRAPHY
Anesthesia: LOCAL

## 2020-06-30 MED ORDER — SODIUM CHLORIDE 0.9 % WEIGHT BASED INFUSION
3.0000 mL/kg/h | INTRAVENOUS | Status: AC
  Administered 2020-06-30: 3 mL/kg/h via INTRAVENOUS

## 2020-06-30 MED ORDER — HEPARIN SODIUM (PORCINE) 1000 UNIT/ML IJ SOLN
INTRAMUSCULAR | Status: DC | PRN
  Administered 2020-06-30: 5000 [IU] via INTRAVENOUS

## 2020-06-30 MED ORDER — FENTANYL CITRATE (PF) 100 MCG/2ML IJ SOLN
INTRAMUSCULAR | Status: AC
  Filled 2020-06-30: qty 2

## 2020-06-30 MED ORDER — HEPARIN (PORCINE) IN NACL 1000-0.9 UT/500ML-% IV SOLN
INTRAVENOUS | Status: DC | PRN
  Administered 2020-06-30 (×2): 500 mL

## 2020-06-30 MED ORDER — ACETAMINOPHEN 325 MG PO TABS: 650.0000 mg | ORAL_TABLET | ORAL | Status: DC | PRN

## 2020-06-30 MED ORDER — LIDOCAINE HCL (PF) 1 % IJ SOLN
INTRAMUSCULAR | Status: DC | PRN
  Administered 2020-06-30: 6 mL via INTRADERMAL

## 2020-06-30 MED ORDER — HEPARIN (PORCINE) IN NACL 1000-0.9 UT/500ML-% IV SOLN
INTRAVENOUS | Status: AC
  Filled 2020-06-30: qty 1500

## 2020-06-30 MED ORDER — SODIUM CHLORIDE 0.9 % IV SOLN: 250.0000 mL | INTRAVENOUS | Status: DC | PRN

## 2020-06-30 MED ORDER — SODIUM CHLORIDE 0.9% FLUSH: 3.0000 mL | INTRAVENOUS | Status: DC | PRN

## 2020-06-30 MED ORDER — METOPROLOL TARTRATE 5 MG/5ML IV SOLN
INTRAVENOUS | Status: DC | PRN
  Administered 2020-06-30: 5 mg via INTRAVENOUS

## 2020-06-30 MED ORDER — METOPROLOL TARTRATE 50 MG PO TABS: 50.0000 mg | ORAL_TABLET | Freq: Two times a day (BID) | ORAL | 1 refills | Status: DC

## 2020-06-30 MED ORDER — METOPROLOL TARTRATE 5 MG/5ML IV SOLN
INTRAVENOUS | Status: AC
  Filled 2020-06-30: qty 5

## 2020-06-30 MED ORDER — SODIUM CHLORIDE 0.9 % WEIGHT BASED INFUSION: 1.0000 mL/kg/h | INTRAVENOUS | Status: DC

## 2020-06-30 MED ORDER — ASPIRIN EC 81 MG PO TBEC: 81.0000 mg | DELAYED_RELEASE_TABLET | Freq: Every day | ORAL | 3 refills | Status: AC

## 2020-06-30 MED ORDER — ASPIRIN 81 MG PO CHEW
81.0000 mg | CHEWABLE_TABLET | ORAL | Status: AC
  Administered 2020-06-30: 81 mg via ORAL
  Filled 2020-06-30: qty 1

## 2020-06-30 MED ORDER — ONDANSETRON HCL 4 MG/2ML IJ SOLN: 4.0000 mg | Freq: Four times a day (QID) | INTRAMUSCULAR | Status: DC | PRN

## 2020-06-30 MED ORDER — VERAPAMIL HCL 2.5 MG/ML IV SOLN
INTRAVENOUS | Status: AC
  Filled 2020-06-30: qty 2

## 2020-06-30 MED ORDER — MIDAZOLAM HCL 2 MG/2ML IJ SOLN
INTRAMUSCULAR | Status: AC
  Filled 2020-06-30: qty 2

## 2020-06-30 MED ORDER — MIDAZOLAM HCL 2 MG/2ML IJ SOLN
INTRAMUSCULAR | Status: DC | PRN
  Administered 2020-06-30 (×2): 1 mg via INTRAVENOUS

## 2020-06-30 MED ORDER — VERAPAMIL HCL 2.5 MG/ML IV SOLN
INTRAVENOUS | Status: DC | PRN
  Administered 2020-06-30: 5 mL via INTRA_ARTERIAL

## 2020-06-30 MED ORDER — SODIUM CHLORIDE 0.9% FLUSH: 3.0000 mL | Freq: Two times a day (BID) | INTRAVENOUS | Status: DC

## 2020-06-30 MED ORDER — IOHEXOL 350 MG/ML SOLN
INTRAVENOUS | Status: DC | PRN
  Administered 2020-06-30: 65 mL

## 2020-06-30 MED ORDER — FENTANYL CITRATE (PF) 100 MCG/2ML IJ SOLN
INTRAMUSCULAR | Status: DC | PRN
  Administered 2020-06-30: 25 ug via INTRAVENOUS

## 2020-06-30 MED ORDER — LIDOCAINE HCL (PF) 1 % IJ SOLN
INTRAMUSCULAR | Status: AC
  Filled 2020-06-30: qty 30

## 2020-06-30 MED ORDER — HEPARIN SODIUM (PORCINE) 1000 UNIT/ML IJ SOLN
INTRAMUSCULAR | Status: AC
  Filled 2020-06-30: qty 1

## 2020-06-30 SURGICAL SUPPLY — 14 items
BAG SNAP BAND KOVER 36X36 (MISCELLANEOUS) ×1 IMPLANT
CATH BALLN WEDGE 5F 110CM (CATHETERS) ×1 IMPLANT
CATH OPTITORQUE TIG 4.0 5F (CATHETERS) ×1 IMPLANT
COVER DOME SNAP 22 D (MISCELLANEOUS) ×1 IMPLANT
DEVICE RAD COMP TR BAND LRG (VASCULAR PRODUCTS) ×1 IMPLANT
GLIDESHEATH SLEND A-KIT 6F 22G (SHEATH) ×1 IMPLANT
GUIDEWIRE INQWIRE 1.5J.035X260 (WIRE) IMPLANT
INQWIRE 1.5J .035X260CM (WIRE) ×2
KIT HEART LEFT (KITS) ×2 IMPLANT
PACK CARDIAC CATHETERIZATION (CUSTOM PROCEDURE TRAY) ×2 IMPLANT
SHEATH GLIDE SLENDER 4/5FR (SHEATH) ×1 IMPLANT
SHEATH PROBE COVER 6X72 (BAG) ×1 IMPLANT
TRANSDUCER W/STOPCOCK (MISCELLANEOUS) ×2 IMPLANT
TUBING CIL FLEX 10 FLL-RA (TUBING) ×2 IMPLANT

## 2020-06-30 NOTE — Discharge Instructions (Signed)
Radial Site Care  This sheet gives you information about how to care for yourself after your procedure. Your health care provider may also give you more specific instructions. If you have problems or questions, contact your health care provider. What can I expect after the procedure? After the procedure, it is common to have:  Bruising and tenderness at the catheter insertion area. Follow these instructions at home: Medicines  Take over-the-counter and prescription medicines only as told by your health care provider. Insertion site care  Follow instructions from your health care provider about how to take care of your insertion site. Make sure you: ? Wash your hands with soap and water before you change your bandage (dressing). If soap and water are not available, use hand sanitizer. ? Change your dressing as told by your health care provider. ? Leave stitches (sutures), skin glue, or adhesive strips in place. These skin closures may need to stay in place for 2 weeks or longer. If adhesive strip edges start to loosen and curl up, you may trim the loose edges. Do not remove adhesive strips completely unless your health care provider tells you to do that.  Check your insertion site every day for signs of infection. Check for: ? Redness, swelling, or pain. ? Fluid or blood. ? Pus or a bad smell. ? Warmth.  Do not take baths, swim, or use a hot tub until your health care provider approves.  You may shower 24-48 hours after the procedure, or as directed by your health care provider. ? Remove the dressing and gently wash the site with plain soap and water. ? Pat the area dry with a clean towel. ? Do not rub the site. That could cause bleeding.  Do not apply powder or lotion to the site. Activity  For 24 hours after the procedure, or as directed by your health care provider: ? Do not flex or bend the affected arm. ? Do not push or pull heavy objects with the affected arm. ? Do not drive  yourself home from the hospital or clinic. You may drive 24 hours after the procedure unless your health care provider tells you not to. ? Do not operate machinery or power tools.  Do not lift anything that is heavier than 10 lb (4.5 kg), or the limit that you are told, until your health care provider says that it is safe.  Ask your health care provider when it is okay to: ? Return to work or school. ? Resume usual physical activities or sports. ? Resume sexual activity.   General instructions  If the catheter site starts to bleed, raise your arm and put firm pressure on the site. If the bleeding does not stop, get help right away. This is a medical emergency.  If you went home on the same day as your procedure, a responsible adult should be with you for the first 24 hours after you arrive home.  Keep all follow-up visits as told by your health care provider. This is important. Contact a health care provider if:  You have a fever.  You have redness, swelling, or yellow drainage around your insertion site. Get help right away if:  You have unusual pain at the radial site.  The catheter insertion area swells very fast.  The insertion area is bleeding, and the bleeding does not stop when you hold steady pressure on the area.  Your arm or hand becomes pale, cool, tingly, or numb. These symptoms may represent a serious   problem that is an emergency. Do not wait to see if the symptoms will go away. Get medical help right away. Call your local emergency services (911 in the [place] Do not drive yourself to the hospital. Summary  After the procedure, it is common to have bruising and tenderness at the site.  Follow instructions from your health care provider about how to take care of your radial site wound. Check the wound every day for signs of infection.  Do not lift anything that is heavier than 10 lb (4.5 kg), or the limit that you are told, until your health care provider says that it  is safe. This information is not intended to replace advice given to you by your health care provider. Make sure you discuss any questions you have with your health care provider. Document Revised: 07/05/2017 Document Reviewed: 07/05/2017 Elsevier Patient Education  2021 Elsevier Inc.  

## 2020-06-30 NOTE — Interval H&P Note (Signed)
Cath Lab Visit (complete for each Cath Lab visit)  Clinical Evaluation Leading to the Procedure:   ACS: No.  Non-ACS:    Anginal Classification: CCS IV  Anti-ischemic medical therapy: Maximal Therapy (2 or more classes of medications)  Non-Invasive Test Results: No non-invasive testing performed  Prior CABG: No previous CABG      History and Physical Interval Note:  06/30/2020 12:41 PM  Tim Williams  has presented today for surgery, with the diagnosis of shortness of breath, afib.  The various methods of treatment have been discussed with the patient and family. After consideration of risks, benefits and other options for treatment, the patient has consented to  Procedure(s): LEFT HEART CATH AND CORONARY ANGIOGRAPHY (N/A) as a surgical intervention.  The patient's history has been reviewed, patient examined, no change in status, stable for surgery.  I have reviewed the patient's chart and labs.  Questions were answered to the patient's satisfaction.     Adrian Prows

## 2020-07-01 NOTE — Progress Notes (Signed)
Pt's son called back and appt moved up

## 2020-07-01 NOTE — Progress Notes (Signed)
Attempted to call pt, no answer. Left vm requesting call back.

## 2020-07-06 ENCOUNTER — Encounter: Payer: Self-pay | Admitting: Cardiology

## 2020-07-06 ENCOUNTER — Other Ambulatory Visit: Payer: Self-pay

## 2020-07-06 ENCOUNTER — Ambulatory Visit: Payer: Medicare Other | Admitting: Cardiology

## 2020-07-06 VITALS — BP 128/74 | HR 63 | Temp 97.6°F | Resp 16 | Ht 67.0 in | Wt 217.0 lb

## 2020-07-06 DIAGNOSIS — I429 Cardiomyopathy, unspecified: Secondary | ICD-10-CM

## 2020-07-06 DIAGNOSIS — I4891 Unspecified atrial fibrillation: Secondary | ICD-10-CM

## 2020-07-06 DIAGNOSIS — R0609 Other forms of dyspnea: Secondary | ICD-10-CM

## 2020-07-06 DIAGNOSIS — E1159 Type 2 diabetes mellitus with other circulatory complications: Secondary | ICD-10-CM

## 2020-07-06 DIAGNOSIS — I10 Essential (primary) hypertension: Secondary | ICD-10-CM

## 2020-07-06 DIAGNOSIS — I5043 Acute on chronic combined systolic (congestive) and diastolic (congestive) heart failure: Secondary | ICD-10-CM

## 2020-07-06 MED ORDER — ENTRESTO 49-51 MG PO TABS
1.0000 | ORAL_TABLET | Freq: Two times a day (BID) | ORAL | 2 refills | Status: DC
Start: 2020-07-06 — End: 2020-07-21

## 2020-07-06 NOTE — Progress Notes (Signed)
Date:  07/06/2020   ID:  Tim Williams, DOB 1951/01/09, MRN [id number]  PCP:  Monico Blitz, MD  Cardiologist:  Rex Kras, DO, [facility] (established care 06/23/2020) Former Cardiology Providers: Cathie Hoops, DO   Date: 07/06/20 Last Office Visit: 06/23/2020  Chief Complaint  Patient presents with  . Acute on chronic combined systolic and diastolic congestive  . Follow-up    HPI  Tim Williams is a 70 y.o. male who presents to the office with a chief complaint of " follow-up after heart catheterization." Patient's past medical history and cardiovascular risk factors include: Multivessel CAD, hyperlipidemia, atrial fibrillation, benign essential hypertension, acute on chronic HFrEF/Stage C/ NYHA class III, type 2 diabetes mellitus, active cigarette smoker, advanced age.  He is referred to the office at the request of Monico Blitz, MD for evaluation of congestive heart failure.  Patient is accompanied by his son Tim Williams will can be reached at [phone].  At the time of initial consultation patient was experiencing dyspnea on exertion with very minimal walking over the last 6 to 12 months.  He has also been hospitalized and treated at [place] for congestive heart failure.  He is noted to have an LVEF of less than 30% on prior echocardiograms as noted below.  He also has underlying atrial fibrillation for unknown period of time and is currently on rate control management as he refuses to be on oral anticoagulation.  Since last office visit, he has undergone left and right heart catheterization.  He was noted to have multivessel CAD but was not revascularized with PCI as there is concern for nonviable myocardium and findings suggestive of LV aneurysm.    He now presents for follow-up.  Patient continues to be short of breath at rest and with effort related activities.  Patient states that the oxygen therapy that he uses for his underlying COPD helps significantly.  He was recommended to  start Entresto at the last office visit but failed to do so due to medication being cost prohibitive.  He did not reach out to the office with this information and therefore was not prescribed an alternative or helped with patient assistance /samples for the given time.  He continues to use 3 L of nasal cannula oxygen around-the-clock.  Unfortunately, despite being oxygen dependent he continues to smoke 1.5 packs/day and understands that this is a fire hazard but continues to do so irrespectively.  Patient's weight remains stable based on his weight scale at home (210-212 pounds).   ALLERGIES: Allergies  Allergen Reactions  . Ibuprofen     Pt can't take due to ulcers.  . Penicillins Other (See Comments)    "knocks out" Childhood allergy Has patient had a PCN reaction causing immediate rash, facial/tongue/throat swelling, SOB or lightheadedness with hypotension: Unknown Has patient had a PCN reaction causing severe rash involving mucus membranes or skin necrosis: Unknown Has patient had a PCN reaction that required hospitalization: No Has patient had a PCN reaction occurring within the last 10 years: No If all of the above answers are "NO", then may proceed with Cephalosporin use.      MEDICATION LIST PRIOR TO VISIT: Current Meds  Medication Sig  . aspirin EC 81 MG tablet Take 1 tablet (81 mg total) by mouth daily.  Marland Kitchen atorvastatin (LIPITOR) 40 MG tablet Take 1 tablet (40 mg total) by mouth daily.  . furosemide (LASIX) 40 MG tablet Take 40 mg by mouth daily.  Marland Kitchen glipiZIDE (GLUCOTROL XL) 5 MG  24 hr tablet Take 5 mg by mouth daily with breakfast.  . metoprolol tartrate (LOPRESSOR) 50 MG tablet Take 1 tablet (50 mg total) by mouth 2 (two) times daily. Can take 2 tablets of 25 mg twice daily until done  . potassium chloride (K-DUR) 10 MEQ tablet Take 10 mEq by mouth 2 (two) times daily.     PAST MEDICAL HISTORY: Past Medical History:  Diagnosis Date  . Arthritis   . Atrial fibrillation  ([place])   . CHF (congestive heart failure) ([place])   . COPD (chronic obstructive pulmonary disease) ([place])   . Diabetes ([place])   . Gastric ulcer    2009  . Hypertension   . Shortness of breath   . Wound healing, delayed n   left upper arm    PAST SURGICAL HISTORY: Past Surgical History:  Procedure Laterality Date  . BACK SURGERY     lumbar discectomy  . BIOPSY  02/23/2018   Procedure: BIOPSY;  Surgeon: Rogene Houston, MD;  [facility];  Service: Endoscopy;;  colon  . COLONOSCOPY WITH PROPOFOL N/A 02/23/2018   Procedure: COLONOSCOPY WITH PROPOFOL;  Surgeon: Rogene Houston, MD;  Location[facility];  Service: Endoscopy;  Laterality: N/A;  2:10  . CYSTOSCOPY     stone removal  . EYE SURGERY     muscle repair right eye-age 71  . HERNIA REPAIR     left age 37  . PARTIAL COLECTOMY N/A 04/02/2018   Procedure: PARTIAL COLECTOMY LOWER ANTERIOR RESECTIONS and biopsy of left arm wound;  Surgeon: Aviva Signs, MD;  Location: [facility];  Service: General;  Laterality: N/A;  . POLYPECTOMY  02/23/2018   Procedure: POLYPECTOMY;  Surgeon: Rogene Houston, MD;  [facility];  Service: Endoscopy;;  colon  . RIGHT/LEFT HEART CATH AND CORONARY ANGIOGRAPHY N/A 06/30/2020   Procedure: RIGHT/LEFT HEART CATH AND CORONARY ANGIOGRAPHY;  Surgeon: Adrian Prows, MD;  Location: [place] [facility];  Service: Cardiovascular;  Laterality: N/A;  . TOTAL KNEE ARTHROPLASTY  05/17/2012   Procedure: TOTAL KNEE ARTHROPLASTY;  Surgeon: Sanjuana Kava, MD;  Location: [facility];  Service: Orthopedics;  Laterality: Right;  . TRANSURETHRAL RESECTION OF PROSTATE      FAMILY HISTORY: The patient family history includes Hypertension in his father and mother.  SOCIAL HISTORY:  The patient  reports that he has been smoking cigarettes. He has a 60.00 pack-year smoking history. He has never used smokeless tobacco. He reports that he does not drink alcohol and does not use drugs.  REVIEW OF SYSTEMS: Review of  Systems  Constitutional: Negative for chills and fever.  HENT: Negative for hoarse voice and nosebleeds.   Eyes: Negative for discharge, double vision and pain.  Cardiovascular: Positive for dyspnea on exertion and leg swelling. Negative for chest pain, claudication, near-syncope, orthopnea, palpitations, paroxysmal nocturnal dyspnea and syncope.  Respiratory: Negative for hemoptysis and shortness of breath.   Musculoskeletal: Negative for muscle cramps and myalgias.  Gastrointestinal: Negative for abdominal pain, constipation, diarrhea, hematemesis, hematochezia, melena, nausea and vomiting.  Neurological: Negative for dizziness and light-headedness.    PHYSICAL EXAM: Vitals with BMI 07/06/2020 06/30/2020 06/30/2020  Height 5\' 7"  - -  Weight 217 lbs - -  BMI [id number] - -  Systolic 0000000 [id number]  Diastolic 74 73 85  Pulse 63 68 29    CONSTITUTIONAL: Well-developed and well-nourished. No acute distress.  SKIN: Skin is warm and dry. No rash noted. No cyanosis. No pallor. No jaundice HEAD:  Normocephalic and atraumatic.  EYES: No scleral icterus MOUTH/THROAT: Moist oral membranes.  NECK: No JVD present. No thyromegaly noted. No carotid bruits  LYMPHATIC: No visible cervical adenopathy.  CHEST Normal respiratory effort. No intercostal retractions.  Equal rise and fall of the chest cavity. LUNGS: Decreased breath sounds bilaterally.  Expiratory wheezes noted.  Rales noted at the bases.  CARDIOVASCULAR: Irregularly irregular, variable H0-Q6, holosystolic murmur heard at the apex, no gallops or rubs. ABDOMINAL: Obese, soft, nontender, nondistended, positive bowel sounds in all 4 quadrants, no apparent ascites.  EXTREMITIES: +2 bilateral peripheral edema up to mid shin, difficult to palpate dorsalis pedis and posterior tibial pulses secondary to edema HEMATOLOGIC: No significant bruising NEUROLOGIC: Oriented to person, place, and time. Nonfocal. Normal muscle tone.  PSYCHIATRIC: Normal mood and  affect. Normal behavior. Cooperative  CARDIAC DATABASE: EKG: 07/06/2020: Atrial fibrillation, 77 bpm, left axis deviation, left anterior fascicular block, poor R wave progression, old anterior infarct, nonspecific T wave abnormality.  Echocardiogram: 05/24/2019 [facility]:  1. Technically difficult study due to chest wall/lung interference and patient position.  2. The left ventricle is moderately dilated in size with mildly increased wall thickness.  3. Severely decreased left ventricular systolic function, ejection fraction 25%.  4. Dilated left atrium - moderately dilated.  5. Mitral regurgitation - moderate.  6. Tricuspid regurgitation - moderate.  7. Aortic sclerosis.  8. Elevated right atrial pressure.  Stress Testing: No results found for this or any previous visit from the past 1095 days.  Heart Catheterization: Left and Right Heart Catheterization 06/30/20:   Right heart catheterization: RA 8/9, mean 8 mmHg.  RV 59/2, EDP 9 mmHg. PA 47/18, mean 31 mmHg.  PA saturation 59%.  PW 24/22, mean 19 mmHg.  Aortic saturation 96%. CO 3.07, CI 1.47 by Fick, reduced cardiac index.  QP/QS 1.00.  Left heart catheterization: LV 101/6, EDP 12 mmHg.  EDP reduced as patient received metoprolol after the right heart catheterization hence may be skewed data.  Aortic pressure 101/63, mean 80 mmHg.  No pressure gradient across the aortic valve. LV: Basal and mid anterolateral dyskinesis, aneurysmal formation.  Hand contrast injection performed, EF is approximate, appears to be about 20 to 25%. RCA: Dominant, high-grade calcific stenosis of 80% to 90% in the proximal segment followed by 60% stenosis in the midsegment, calcific. Left main: Large vessel, mild calcification present. Circumflex: Very large caliber vessel, gives origin to large OM1 which has a proximal 80% calcific stenosis.  Large OM two with a proximal 90% calcific stenosis.  OM three is occluded after its origin.  AV groove  circumflex has mild disease. LAD: Large vessel.  Gives origin to a very large diagonal which reaches the apex.  Diagonal one has calcific, irregular lumen stenosis in the proximal segment of 85%.  LAD is occluded after the origin of D1 and has bridging collaterals and also ipsilateral collaterals.  LAD appears to be moderate sized and appears to be disease-free in the mid to distal segment after reconstitution.  There is a large D4 as well. Ramus intermediate: Small vessel and disease-free.  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  Also patient is not on any optimal medical therapy and there is questionable noncompliance with medical follow-up.  Patient also has persistent atrial fibrillation, but is not on anticoagulation as he refuses anticoagulation.  I discussed the  findings with patient's son and had a long discussion regarding his extremely guarded prognosis and high risk of stroke as well.  For now I have recommended medical therapy.  Patient has significant difficulty with transportation, we will see whether we can set up cardiology follow-up with Dr. Cathie Hoops in [place] with [facility].  LABORATORY DATA: CBC Latest Ref Rng & Units 06/30/2020 06/30/2020 06/30/2020  WBC 3.4 - 10.8 x10E3/uL - - -  Hemoglobin 13.0 - 17.0 g/dL 16.3 16.3 16.7  Hematocrit 39.0 - 52.0 % 48.0 48.0 49.0  Platelets 150 - 450 x10E3/uL - - -    CMP Latest Ref Rng & Units 06/30/2020 06/30/2020 06/30/2020  Glucose 65 - 99 mg/dL - - -  BUN 8 - 27 mg/dL - - -  Creatinine 0.76 - 1.27 mg/dL - - -  Sodium 135 - 145 mmol/L 140 141 143  Potassium 3.5 - 5.1 mmol/L 3.5 3.5 3.6  Chloride 96 - 106 mmol/L - - -  CO2 20 - 29 mmol/L - - -  Calcium 8.6 - 10.2 mg/dL - - -  Total Protein 6.5 - 8.1 g/dL - - -  Total Bilirubin 0.3 - 1.2 mg/dL - - -   Alkaline Phos 38 - 126 U/L - - -  AST 15 - 41 U/L - - -  ALT 0 - 44 U/L - - -    Lipid Panel     Component Value Date/Time   CHOL 197 06/26/2020 1500   TRIG 136 06/26/2020 1500   HDL 45 06/26/2020 1500   LDLCALC 128 (H) 06/26/2020 1500   LDLDIRECT 129 (H) 06/26/2020 1500   LABVLDL 24 06/26/2020 1500    No components found for: NTPROBNP Recent Labs    06/26/20 1500  PROBNP 1,881*   No results for input(s): TSH in the last 8760 hours.  BMP Recent Labs    06/26/20 1500 06/30/20 1305 06/30/20 1310 06/30/20 1313  NA 143 143 141 140  K 3.6 3.6 3.5 3.5  CL 97  --   --   --   CO2 33*  --   --   --   GLUCOSE 101*  --   --   --   BUN 17  --   --   --   CREATININE 1.09  --   --   --   CALCIUM 9.3  --   --   --   GFRNONAA 70  --   --   --   GFRAA 81  --   --   --     HEMOGLOBIN A1C Lab Results  Component Value Date   HGBA1C 6.7 (H) 04/02/2018   MPG 146 04/02/2018    External Labs 06/11/2020: [facility] I peak 232 proBNP 221. Sodium 143, potassium 3.5, chloride 101, bicarb 31, BUN 20, creatinine 1, AST 20, ALT 19, alkaline phosphatase 51   IMPRESSION:    ICD-10-CM   1. Acute on chronic combined systolic and diastolic congestive heart failure (HCC)  I50.43 EKG 12-Lead    sacubitril-valsartan (ENTRESTO) 49-51 MG    Basic metabolic panel    Pro b natriuretic peptide (BNP)    Magnesium  2. Cardiomyopathy, unspecified type ([place])  I42.9   3. Other form of dyspnea  R06.09   4. Benign hypertension  I10   5. Type 2 diabetes mellitus with other circulatory complication, without long-term current use of insulin (HCC)  E11.59   6. Atrial fibrillation, unspecified type ([place])  I48.91  RECOMMENDATIONS: KAMSIYOCHUKWU MACVICAR is a 70 y.o. male whose past medical history and cardiac risk factors include:  Multivessel CAD, hyperlipidemia, atrial fibrillation, benign essential hypertension, acute on chronic HFrEF/Stage C/ NYHA class III, type 2 diabetes mellitus, active  cigarette smoker, advanced age.  Acute on chronic exacerbation of combined systolic and diastolic congestive heart failure, stage C, NYHA class III:  Initiate Entresto 49/51 mg p.o. twice daily.  Samples provided for 2 weeks.  And also initiated patient assistance paperwork during today's encounter.  Continue Lasix and potassium supplements.  Continue Lopressor 50 mg p.o. twice daily  Continue aspirin and statin therapy.  Check blood work in 1 week to evaluate kidney function and electrolytes.  We will continue to uptitrate guideline directed medical therapy in a stepwise fashion.  Ischemic cardiomyopathy:  Most recent echocardiogram in 2020 at [facility] available in care everywhere which notes severely reduced left ventricular systolic function.  Since then he has not undergone an ischemic evaluation to rule out obstructive CAD.  Will uptitrate guideline directed medical therapy, once optimized will require follow up echo in 90 days to evaluate candidacy for AICD for primary prevention. Patient and son voice understanding.   Multivessel CAD:  Patient underwent a left heart catheterization since last office visit and is noted to have multivessel CAD.  He did not undergo percutaneous revascularization as the findings appeared to be chronic and there is concern for nonviable myocardium and LV gram suggestive of possible LV aneurysm.  The shared decision was to uptitrate guideline directed medical therapy and if patient is able to tolerate medical therapy and demonstrates compliance would recommend a viability study once optimized on GDMT.  If he does have viable myocardium he would benefit from cardiothoracic consultation to be considered for possible bypass surgery.  Plan of care discussed with both the patient and his son at today's visit.  Both are in agreement.  Atrial fibrillation, chronicity unknown  Rate control: Metoprolol.  Rhythm control: N/A  Thromboembolic prophylaxis:  Aspirin  CHA2DS2-VASc SCORE is 5 which correlates to 7.2 % risk of stroke per year (age, diabetes, CAD hypertension, congestive heart failure).  Patient educated that aspirin is not strong enough to prevent thromboembolic events.  We discussed oral anticoagulation options including Coumadin and NOAC.  Patient states that in the past he was prescribed NOAC but because of it being cost prohibitive he chose to be on aspirin only.  I informed both him and his son that Coumadin is an alternative.  He continues to refuse anticoagulation.  Patient is asked to be more cognizant for symptoms suggestive of stroke and to seek medical attention sooner by going to the closest ER via EMS if such symptoms arise.    Benign essential hypertension: Continue current medical therapy.  Currently managed by primary care provider.  Non-insulin-dependent diabetes mellitus type 2 with circulatory complications:  Education the importance of glycemic control.  Patient is not on statin therapy despite being a diabetic.  Fasting lipid profile independently reviewed from 06/26/2020.  Total cholesterol 197, HDL 45, direct LDL 129.  Start atorvastatin 40 mg p.o. nightly thereafter.  Mixed hyperlipidemia:  Fasting lipid profile independently reviewed from 06/26/2020.  Total cholesterol 197, HDL 45, direct LDL 129.  Start atorvastatin 40 mg p.o. nightly.  Active tobacco use:  Continues to smoke 1.5 packs/day despite his chronic comorbidities as noted above.  He continues to smoke despite requiring 3 L of nasal cannula oxygen around-the-clock.  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  Patient responds " I spoke 15 feet away from the oxygen tank."  Patient was informed of the dangers of tobacco abuse including stroke, cancer, and MI, as well as benefits of tobacco cessation.  Patient is not willing to quit at this time.  Approximately 8 mins were spent counseling patient  cessation techniques. We discussed various methods to help quit smoking, including deciding on a date to quit, joining a support group, pharmacological agents- nicotine gum/patch/lozenges, chantix.   I will reassess his progress at the next follow-up visit  Total time spent 59 minutes. Discussed with both the patient and his son the results of the left heart catheterization.  The importance of uptitrating guideline directed medical therapy and to consider viability study and/or repeating an echocardiogram in 90 days once his medications have been optimized as discussed above.  Complex decision making as discussed above.  Overall prognosis guarded.  Coordination of care.   FINAL MEDICATION LIST END OF ENCOUNTER: Meds ordered this encounter  Medications  . sacubitril-valsartan (ENTRESTO) 49-51 MG    Sig: Take 1 tablet by mouth 2 (two) times daily.    Dispense:  60 tablet    Refill:  2    Current Outpatient Medications:  .  aspirin EC 81 MG tablet, Take 1 tablet (81 mg total) by mouth daily., Disp: 90 tablet, Rfl: 3 .  atorvastatin (LIPITOR) 40 MG tablet, Take 1 tablet (40 mg total) by mouth daily., Disp: 90 tablet, Rfl: 3 .  furosemide (LASIX) 40 MG tablet, Take 40 mg by mouth daily., Disp: , Rfl:  .  glipiZIDE (GLUCOTROL XL) 5 MG 24 hr tablet, Take 5 mg by mouth daily with breakfast., Disp: , Rfl:  .  metoprolol tartrate (LOPRESSOR) 50 MG tablet, Take 1 tablet (50 mg total) by mouth 2 (two) times daily. Can take 2 tablets of 25 mg twice daily until done, Disp: 60 tablet, Rfl: 1 .  potassium chloride (K-DUR) 10 MEQ tablet, Take 10 mEq by mouth 2 (two) times daily., Disp: , Rfl:  .  sacubitril-valsartan (ENTRESTO) 49-51 MG, Take 1 tablet by mouth 2 (two) times daily., Disp: 60 tablet, Rfl: 2  Orders Placed This Encounter  Procedures  . Basic metabolic panel  . Pro b natriuretic peptide (BNP)  . Magnesium  . EKG 12-Lead    There are no Patient Instructions on file for this visit.    --Continue cardiac medications as reconciled in final medication list. --Return in about 2 weeks (around 07/20/2020) for Follow up, heart failure management.. Or sooner if needed. --Continue follow-up with your primary care physician regarding the management of your other chronic comorbid conditions.  Patient's questions and concerns were addressed to his satisfaction. He voices understanding of the instructions provided during this encounter.   This note was created using a voice recognition software as a result there may be grammatical errors inadvertently enclosed that do not reflect the nature of this encounter. Every attempt is made to correct such errors.  Rex Kras, [place], [facility]  Pager: [phone] Office: [phone]

## 2020-07-08 ENCOUNTER — Other Ambulatory Visit: Payer: Self-pay

## 2020-07-08 DIAGNOSIS — I5043 Acute on chronic combined systolic (congestive) and diastolic (congestive) heart failure: Secondary | ICD-10-CM

## 2020-07-14 LAB — BASIC METABOLIC PANEL
BUN/Creatinine Ratio: 16 (ref 10–24)
BUN: 16 mg/dL (ref 8–27)
CO2: 31 mmol/L — ABNORMAL HIGH (ref 20–29)
Calcium: 9.3 mg/dL (ref 8.6–10.2)
Chloride: 102 mmol/L (ref 96–106)
Creatinine, Ser: 1.02 mg/dL (ref 0.76–1.27)
GFR calc Af Amer: 88 mL/min/{1.73_m2} (ref >59)
GFR calc non Af Amer: 76 mL/min/{1.73_m2} (ref >59)
Glucose: 97 mg/dL (ref 65–99)
Potassium: 3.8 mmol/L (ref 3.5–5.2)
Sodium: 144 mmol/L (ref 134–144)

## 2020-07-14 LAB — PRO B NATRIURETIC PEPTIDE: NT-Pro BNP: 2195 pg/mL — ABNORMAL HIGH (ref 0–376)

## 2020-07-14 LAB — MAGNESIUM: Magnesium: 2.3 mg/dL (ref 1.6–2.3)

## 2020-07-15 ENCOUNTER — Telehealth: Payer: Self-pay | Admitting: Cardiology

## 2020-07-15 NOTE — Telephone Encounter (Signed)
2 attempt no answer left vm

## 2020-07-15 NOTE — Telephone Encounter (Signed)
Please call the patient or his son encouraged to make sure that his Delene Loll medication has been approved by insurance and affordable.  If the medication is not affordable please help him with prior authorization.  Please have the front desk call Gerald Stabs  to make a follow-up appointment for July 20, 2020.  I spoke to patient's son Suezanne Jacquet today and reviewed the most recent labs.   Rex Kras, [place], [facility]  Pager: [phone] Office: [phone]

## 2020-07-15 NOTE — Telephone Encounter (Signed)
No answer left a vm will try again later

## 2020-07-16 ENCOUNTER — Telehealth: Payer: Self-pay

## 2020-07-16 NOTE — Telephone Encounter (Signed)
For documentation*  Spoke to patient son Tim Williams yesterday afternoon he states patient isn't filling out patient assistant for [facility] patient is going to finish the samples we provided but afterwards patient doesn't want to be on medication

## 2020-07-16 NOTE — Telephone Encounter (Signed)
Lyndon Code spoke to the pt yesterday afternoon. Pt voiced understanding and has scheduled a follow up.

## 2020-07-21 ENCOUNTER — Encounter: Payer: Self-pay | Admitting: Cardiology

## 2020-07-21 ENCOUNTER — Ambulatory Visit: Payer: Medicare Other | Admitting: Cardiology

## 2020-07-21 ENCOUNTER — Other Ambulatory Visit: Payer: Self-pay

## 2020-07-21 VITALS — BP 141/95 | HR 86 | Temp 97.9°F | Resp 16 | Ht 67.0 in | Wt 222.0 lb

## 2020-07-21 DIAGNOSIS — I5043 Acute on chronic combined systolic (congestive) and diastolic (congestive) heart failure: Secondary | ICD-10-CM

## 2020-07-21 DIAGNOSIS — I4891 Unspecified atrial fibrillation: Secondary | ICD-10-CM

## 2020-07-21 DIAGNOSIS — I10 Essential (primary) hypertension: Secondary | ICD-10-CM

## 2020-07-21 DIAGNOSIS — R0609 Other forms of dyspnea: Secondary | ICD-10-CM

## 2020-07-21 DIAGNOSIS — I429 Cardiomyopathy, unspecified: Secondary | ICD-10-CM

## 2020-07-21 DIAGNOSIS — E1159 Type 2 diabetes mellitus with other circulatory complications: Secondary | ICD-10-CM

## 2020-07-21 MED ORDER — LOSARTAN POTASSIUM 50 MG PO TABS: 50.0000 mg | ORAL_TABLET | Freq: Every day | ORAL | 0 refills | Status: DC

## 2020-07-21 NOTE — Progress Notes (Signed)
Date:  07/21/2020   ID:  ELJAY LAVE, DOB 1951-01-26, MRN [id number]  PCP:  Monico Blitz, MD  Cardiologist:  Rex Kras, DO, [facility] (established care 06/23/2020) Former Cardiology Providers: Cathie Hoops, DO   Date: 07/21/20 Last Office Visit: 07/06/2020  Chief Complaint  Patient presents with  . Acute on chronic combined systolic and diastolic congestive  . Follow-up    HPI  Tim Williams is a 70 y.o. male who presents to the office with a chief complaint of " follow-up congestive heart failure." Patient's past medical history and cardiovascular risk factors include: Multivessel CAD, hyperlipidemia, atrial fibrillation, benign essential hypertension, acute on chronic HFrEF/Stage C/ NYHA class III, type 2 diabetes mellitus, active cigarette smoker, advanced age.  He is referred to the office at the request of Monico Blitz, MD for evaluation of congestive heart failure.  Patient is accompanied by his son Tim Williams will can be reached at [phone].  At the time of initial consultation patient was experiencing dyspnea on exertion with very minimal walking over the last 6 to 12 months.  He has also been hospitalized and treated at [facility] for congestive heart failure.  He is noted to have an LVEF of less than 30% on prior echocardiograms as noted below.  He also has underlying atrial fibrillation for unknown period of time and is currently on rate control management as he refuses to be on oral anticoagulation.  Since establishing care with the patient he has undergone left and right heart catheterization.  He was noted to have multivessel CAD but was not revascularized with PCI as there is concern for nonviable myocardium and findings suggestive of LV aneurysm. Shared decision was to uptitrate GDMT to maximal doses and then performing a viability study. If he does have viable myocardium than refer to cardiothoracic surgery for possible CABG.   At last office visit I started Entresto  49/51 mg p.o. twice daily.  I provided him 2 weeks worth of samples and started the prior authorization process as the medication was deemed to be cost prohibitive.  However, patient does not want to have the prior authorization process complete as it requires him to disclose his financial information and he refuses to do so.  In addition he also does not want to be on guideline directed medical therapy as he requires close follow-up to make sure his kidney function and electrolytes remain stable as medications get titrated up.   Patient increases his Lasix on as needed basis when he notices weight gain.  He has gained approximately 5 pounds at the last office visit.  He continues to use 3 L of nasal cannula oxygen around-the-clock.  Unfortunately, despite being oxygen dependent he continues to smoke 1.5 packs/day and understands that this is a fire hazard but continues to do so irrespectively.  Patient's weight remains stable based on his weight scale at home (210-212 pounds).   Patient voices that he no longer wants to follow-up with [facility] because of transportation issues.  He is unable to get here without the assistance of either one of his sons.  Patient is requesting closer follow-up with cardiology in [facility].  ALLERGIES: Allergies  Allergen Reactions  . Ibuprofen     Pt can't take due to ulcers.  . Penicillins Other (See Comments)    "knocks out" Childhood allergy Has patient had a PCN reaction causing immediate rash, facial/tongue/throat swelling, SOB or lightheadedness with hypotension: Unknown Has patient had a PCN reaction causing  severe rash involving mucus membranes or skin necrosis: Unknown Has patient had a PCN reaction that required hospitalization: No Has patient had a PCN reaction occurring within the last 10 years: No If all of the above answers are "NO", then may proceed with Cephalosporin use.      MEDICATION LIST PRIOR TO VISIT: Current  Meds  Medication Sig  . aspirin EC 81 MG tablet Take 1 tablet (81 mg total) by mouth daily.  Marland Kitchen atorvastatin (LIPITOR) 40 MG tablet Take 1 tablet (40 mg total) by mouth daily.  . furosemide (LASIX) 40 MG tablet Take 40 mg by mouth daily.  Marland Kitchen glipiZIDE (GLUCOTROL XL) 5 MG 24 hr tablet Take 5 mg by mouth daily with breakfast.  . losartan (COZAAR) 50 MG tablet Take 1 tablet (50 mg total) by mouth daily.  . metoprolol tartrate (LOPRESSOR) 50 MG tablet Take 1 tablet (50 mg total) by mouth 2 (two) times daily. Can take 2 tablets of 25 mg twice daily until done  . potassium chloride (K-DUR) 10 MEQ tablet Take 10 mEq by mouth 2 (two) times daily.  . [DISCONTINUED] sacubitril-valsartan (ENTRESTO) 49-51 MG Take 1 tablet by mouth 2 (two) times daily.     PAST MEDICAL HISTORY: Past Medical History:  Diagnosis Date  . Arthritis   . Atrial fibrillation ([place])   . CHF (congestive heart failure) ([place])   . COPD (chronic obstructive pulmonary disease) ([place])   . Diabetes ([place])   . Gastric ulcer    2009  . Hypertension   . Shortness of breath   . Wound healing, delayed n   left upper arm    PAST SURGICAL HISTORY: Past Surgical History:  Procedure Laterality Date  . BACK SURGERY     lumbar discectomy  . BIOPSY  02/23/2018   Procedure: BIOPSY;  Surgeon: Rogene Houston, MD;  Location[facility];  Service: Endoscopy;;  colon  . COLONOSCOPY WITH PROPOFOL N/A 02/23/2018   Procedure: COLONOSCOPY WITH PROPOFOL;  Surgeon: Rogene Houston, MD;  Location: [facility];  Service: Endoscopy;  Laterality: N/A;  2:10  . CYSTOSCOPY     stone removal  . EYE SURGERY     muscle repair right eye-age 86  . HERNIA REPAIR     left age 40  . PARTIAL COLECTOMY N/A 04/02/2018   Procedure: PARTIAL COLECTOMY LOWER ANTERIOR RESECTIONS and biopsy of left arm wound;  Surgeon: Aviva Signs, MD;  Location: [facility] ORS;  Service: General;  Laterality: N/A;  . POLYPECTOMY  02/23/2018   Procedure: POLYPECTOMY;  Surgeon: Rogene Houston, MD;  Location[facility];  Service: Endoscopy;;  colon  . RIGHT/LEFT HEART CATH AND CORONARY ANGIOGRAPHY N/A 06/30/2020   Procedure: RIGHT/LEFT HEART CATH AND CORONARY ANGIOGRAPHY;  Surgeon: Adrian Prows, MD;  Location: [facility];  Service: Cardiovascular;  Laterality: N/A;  . TOTAL KNEE ARTHROPLASTY  05/17/2012   Procedure: TOTAL KNEE ARTHROPLASTY;  Surgeon: Sanjuana Kava, MD;  Location: [facility];  Service: Orthopedics;  Laterality: Right;  . TRANSURETHRAL RESECTION OF PROSTATE      FAMILY HISTORY: The patient family history includes Hypertension in his father and mother.  SOCIAL HISTORY:  The patient  reports that he has been smoking cigarettes. He has a 60.00 pack-year smoking history. He has never used smokeless tobacco. He reports that he does not drink alcohol and does not use drugs.  REVIEW OF SYSTEMS: Review of Systems  Constitutional: Negative for chills and fever.  HENT: Negative for hoarse  voice and nosebleeds.   Eyes: Negative for discharge, double vision and pain.  Cardiovascular: Positive for dyspnea on exertion and leg swelling. Negative for chest pain, claudication, near-syncope, orthopnea, palpitations, paroxysmal nocturnal dyspnea and syncope.  Respiratory: Negative for hemoptysis and shortness of breath.   Musculoskeletal: Negative for muscle cramps and myalgias.  Gastrointestinal: Negative for abdominal pain, constipation, diarrhea, hematemesis, hematochezia, melena, nausea and vomiting.  Neurological: Negative for dizziness and light-headedness.    PHYSICAL EXAM: Vitals with BMI 07/21/2020 07/21/2020 07/06/2020  Height - 5\' 7"  5\' 7"   Weight - 222 lbs 217 lbs  BMI - 93.79 02.40  Systolic 973 532 992  Diastolic 95 426 74  Pulse 86 60 63    CONSTITUTIONAL: Well-developed and well-nourished. No acute distress.  SKIN: Skin is warm and dry. No rash noted. No cyanosis. No pallor. No jaundice HEAD: Normocephalic and atraumatic.  EYES: No scleral  icterus MOUTH/THROAT: Moist oral membranes.  NECK: No JVD present. No thyromegaly noted. No carotid bruits  LYMPHATIC: No visible cervical adenopathy.  CHEST Normal respiratory effort. No intercostal retractions.  Equal rise and fall of the chest cavity. LUNGS: Decreased breath sounds bilaterally.  Expiratory wheezes noted.  Rales noted at the bases.  CARDIOVASCULAR: Irregularly irregular, variable S3-M1, holosystolic murmur heard at the apex, no gallops or rubs. ABDOMINAL: Obese, soft, nontender, nondistended, positive bowel sounds in all 4 quadrants, no apparent ascites.  EXTREMITIES: +2 bilateral peripheral edema up to mid shin, difficult to palpate dorsalis pedis and posterior tibial pulses secondary to edema HEMATOLOGIC: No significant bruising NEUROLOGIC: Oriented to person, place, and time. Nonfocal. Normal muscle tone.  PSYCHIATRIC: Normal mood and affect. Normal behavior. Cooperative  CARDIAC DATABASE: EKG: 07/06/2020: Atrial fibrillation, 77 bpm, left axis deviation, left anterior fascicular block, poor R wave progression, old anterior infarct, nonspecific T wave abnormality.  Echocardiogram: 05/24/2019 [facility]:  1. Technically difficult study due to chest wall/lung interference and patient position.  2. The left ventricle is moderately dilated in size with mildly increased wall thickness.  3. Severely decreased left ventricular systolic function, ejection fraction 25%.  4. Dilated left atrium - moderately dilated.  5. Mitral regurgitation - moderate.  6. Tricuspid regurgitation - moderate.  7. Aortic sclerosis.  8. Elevated right atrial pressure.  Stress Testing: No results found for this or any previous visit from the past 1095 days.  Heart Catheterization: Left and Right Heart Catheterization 06/30/20:   Right heart catheterization: RA 8/9, mean 8 mmHg.  RV 59/2, EDP 9 mmHg. PA 47/18, mean 31 mmHg.  PA saturation 59%.  PW 24/22, mean 19 mmHg.  Aortic  saturation 96%. CO 3.07, CI 1.47 by Fick, reduced cardiac index.  QP/QS 1.00.  Left heart catheterization: LV 101/6, EDP 12 mmHg.  EDP reduced as patient received metoprolol after the right heart catheterization hence may be skewed data.  Aortic pressure 101/63, mean 80 mmHg.  No pressure gradient across the aortic valve. LV: Basal and mid anterolateral dyskinesis, aneurysmal formation.  Hand contrast injection performed, EF is approximate, appears to be about 20 to 25%. RCA: Dominant, high-grade calcific stenosis of 80% to 90% in the proximal segment followed by 60% stenosis in the midsegment, calcific. Left main: Large vessel, mild calcification present. Circumflex: Very large caliber vessel, gives origin to large OM1 which has a proximal 80% calcific stenosis.  Large OM two with a proximal 90% calcific stenosis.  OM three is occluded after its origin.  AV groove circumflex has mild disease. LAD: Large vessel.  Gives  origin to a very large diagonal which reaches the apex.  Diagonal one has calcific, irregular lumen stenosis in the proximal segment of 85%.  LAD is occluded after the origin of D1 and has bridging collaterals and also ipsilateral collaterals.  LAD appears to be moderate sized and appears to be disease-free in the mid to distal segment after reconstitution.  There is a large D4 as well. Ramus intermediate: Small vessel and disease-free.  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  Also patient is not on any optimal medical therapy and there is questionable noncompliance with medical follow-up.  Patient also has persistent atrial fibrillation, but is not on anticoagulation as he refuses anticoagulation.  I discussed the findings with patient's son and had a long discussion regarding his extremely guarded prognosis and high  risk of stroke as well.  For now I have recommended medical therapy.  Patient has significant difficulty with transportation, we will see whether we can set up cardiology follow-up with Dr. Cathie Hoops in [place] with [facility].  LABORATORY DATA: CBC Latest Ref Rng & Units 06/30/2020 06/30/2020 06/30/2020  WBC 3.4 - 10.8 x10E3/uL - - -  Hemoglobin 13.0 - 17.0 g/dL 16.3 16.3 16.7  Hematocrit 39.0 - 52.0 % 48.0 48.0 49.0  Platelets 150 - 450 x10E3/uL - - -    CMP Latest Ref Rng & Units 07/13/2020 06/30/2020 06/30/2020  Glucose 65 - 99 mg/dL 97 - -  BUN 8 - 27 mg/dL 16 - -  Creatinine 0.76 - 1.27 mg/dL 1.02 - -  Sodium 134 - 144 mmol/L 144 140 141  Potassium 3.5 - 5.2 mmol/L 3.8 3.5 3.5  Chloride 96 - 106 mmol/L 102 - -  CO2 20 - 29 mmol/L 31(H) - -  Calcium 8.6 - 10.2 mg/dL 9.3 - -  Total Protein 6.5 - 8.1 g/dL - - -  Total Bilirubin 0.3 - 1.2 mg/dL - - -  Alkaline Phos 38 - 126 U/L - - -  AST 15 - 41 U/L - - -  ALT 0 - 44 U/L - - -    Lipid Panel     Component Value Date/Time   CHOL 197 06/26/2020 1500   TRIG 136 06/26/2020 1500   HDL 45 06/26/2020 1500   LDLCALC 128 (H) 06/26/2020 1500   LDLDIRECT 129 (H) 06/26/2020 1500   LABVLDL 24 06/26/2020 1500    No components found for: NTPROBNP Recent Labs    06/26/20 1500 07/13/20 1244  PROBNP 1,881* 2,195*   No results for input(s): TSH in the last 8760 hours.  BMP Recent Labs    06/26/20 1500 06/30/20 1305 06/30/20 1310 06/30/20 1313 07/13/20 1243  NA 143   < > 141 140 144  K 3.6   < > 3.5 3.5 3.8  CL 97  --   --   --  102  CO2 33*  --   --   --  31*  GLUCOSE 101*  --   --   --  97  BUN 17  --   --   --  16  CREATININE 1.09  --   --   --  1.02  CALCIUM 9.3  --   --   --  9.3  GFRNONAA 70  --   --   --  76  GFRAA 81  --   --   --  88   < > = values in this interval not displayed.    HEMOGLOBIN A1C Lab Results  Component Value Date   HGBA1C 6.7 (H) 04/02/2018   MPG 146 04/02/2018    External Labs  06/11/2020: [facility] I peak 232 proBNP 221. Sodium 143, potassium 3.5, chloride 101, bicarb 31, BUN 20, creatinine 1, AST 20, ALT 19, alkaline phosphatase 51   IMPRESSION:    ICD-10-CM   1. Acute on chronic combined systolic and diastolic congestive heart failure (HCC)  I50.43 losartan (COZAAR) 50 MG tablet    Basic metabolic panel    Magnesium  2. Cardiomyopathy, unspecified type ([place])  I42.9   3. Other form of dyspnea  R06.09   4. Type 2 diabetes mellitus with other circulatory complication, without long-term current use of insulin (HCC)  E11.59   5. Benign hypertension  I10   6. Atrial fibrillation, unspecified type ([place])  I48.91      RECOMMENDATIONS: STILES MAXCY is a 70 y.o. male whose past medical history and cardiac risk factors include:  Multivessel CAD, hyperlipidemia, atrial fibrillation, benign essential hypertension, acute on chronic HFrEF/Stage C/ NYHA class III, type 2 diabetes mellitus, active cigarette smoker, advanced age.  Acute on chronic exacerbation of combined systolic and diastolic congestive heart failure, stage C, NYHA class III:  Refuses to be on Entresto as it is cost prohibitive.  He does not want to proceed with prior authorization as he would have to disclose his financial information.  We will transition him back to losartan 50 mg p.o. daily.  Blood work in 1 week to evaluate kidney function.  Continue Lasix and potassium supplements.  Continue Lopressor 50 mg p.o. twice daily   Continue aspirin and statin therapy.    Up titration of guideline directed medical therapy has been challenging both because of compliance issues and medication costs.    NT proBNP trending up, he has gained 5 pounds since last office visit.  Discussed transitioning him from Lasix to torsemide but he would like to discuss it further with Dr. Manuella Ghazi later this week on Friday when he follows up with his PCP.  At this point would focus on keeping him euvolemic and on  maximally tolerated heart failure medications as possible.  Ischemic cardiomyopathy: Recommend up titration of guideline directed medical therapy and repeat echocardiogram 90 days after to reevaluate LV function.  Multivessel CAD:  Patient underwent a left heart catheterization since last office visit and is noted to have multivessel CAD.  He did not undergo percutaneous revascularization as the findings appeared to be chronic and there is concern for nonviable myocardium and LV gram suggestive of possible LV aneurysm.  The shared decision was to uptitrate guideline directed medical therapy and if patient is able to tolerate medical therapy and demonstrates compliance would recommend a viability study once optimized on GDMT.  If he does have viable myocardium he would benefit from cardiothoracic consultation to be considered for possible bypass surgery.  Plan of care discussed with both the patient and his son at today's visit.  Both are in agreement.  Atrial fibrillation, chronicity unknown  Rate control: Metoprolol.  Rhythm control: N/A  Thromboembolic prophylaxis: Aspirin  CHA2DS2-VASc SCORE is 5 which correlates to 7.2 % risk of stroke per year (age, diabetes, CAD hypertension, congestive heart failure).  Patient educated that aspirin is not strong enough to prevent thromboembolic events.  We discussed oral anticoagulation options including Coumadin and NOAC.  Patient states that in the past  he was prescribed NOAC but because of it being cost prohibitive he chose to be on aspirin only.  I informed both him and his son that Coumadin is an alternative.  He continues to refuse anticoagulation.  Patient is asked to be more cognizant for symptoms suggestive of stroke and to seek medical attention sooner by going to the closest ER via EMS if such symptoms arise.    Benign essential hypertension: Continue current medical therapy.  Currently managed by primary care  provider.  Non-insulin-dependent diabetes mellitus type 2 with circulatory complications:  Education the importance of glycemic control.  Patient is not on statin therapy despite being a diabetic.  Initiated atorvastatin 40 mg p.o. nightly back in July 06, 2020.    Mixed hyperlipidemia:  Continue statin therapy.    Does not endorse myalgias.    Continue to follow his lipids and uptitrate as needed.    Active tobacco use:  Continues to smoke 1.5 packs/day despite his chronic comorbidities as noted above.  He continues to smoke despite requiring 3 L of nasal cannula oxygen around-the-clock.  Patient understands that smoking in the presence of oxygen tank is a fire hazard.  Patient responds " I spoke 15 feet away from the oxygen tank."  Patient was informed of the dangers of tobacco abuse including stroke, cancer, and MI, as well as benefits of tobacco cessation.  Patient is not willing to quit at this time.  Approximately 5 mins were spent counseling patient cessation techniques. We discussed various methods to help quit smoking, including deciding on a date to quit, joining a support group, pharmacological agents- nicotine gum/patch/lozenges, chantix.   I will reassess his progress at the next follow-up visit  Total time spent 45 minutes. Discussed the management of his chronic comorbid conditions with his son Tim Williams at today's office visit.  Patient's care has been challenging due to his other chronic comorbid conditions, he continues to smoke at least 1.5 pack/day despite needing 3 L nasal cannula oxygen, up titration of guideline order medical therapy has been challenging due to cost prohibitive and his unwillingness to apply for prior authorization to see if he qualifies for assistnace.  Independently reviewed labs from 07/13/2020.  Plan of care also discussed with his primary care provider Dr. Manuella Ghazi as patient would like to have a cardiologist closer to his home town in [place].   Patient is more than welcome to see me back in [place] if he chooses to do so.  FINAL MEDICATION LIST END OF ENCOUNTER: Meds ordered this encounter  Medications  . losartan (COZAAR) 50 MG tablet    Sig: Take 1 tablet (50 mg total) by mouth daily.    Dispense:  30 tablet    Refill:  0    Current Outpatient Medications:  .  aspirin EC 81 MG tablet, Take 1 tablet (81 mg total) by mouth daily., Disp: 90 tablet, Rfl: 3 .  atorvastatin (LIPITOR) 40 MG tablet, Take 1 tablet (40 mg total) by mouth daily., Disp: 90 tablet, Rfl: 3 .  furosemide (LASIX) 40 MG tablet, Take 40 mg by mouth daily., Disp: , Rfl:  .  glipiZIDE (GLUCOTROL XL) 5 MG 24 hr tablet, Take 5 mg by mouth daily with breakfast., Disp: , Rfl:  .  losartan (COZAAR) 50 MG tablet, Take 1 tablet (50 mg total) by mouth daily., Disp: 30 tablet, Rfl: 0 .  metoprolol tartrate (LOPRESSOR) 50 MG tablet, Take 1 tablet (50 mg total) by mouth 2 (two) times daily. Can  take 2 tablets of 25 mg twice daily until done, Disp: 60 tablet, Rfl: 1 .  potassium chloride (K-DUR) 10 MEQ tablet, Take 10 mEq by mouth 2 (two) times daily., Disp: , Rfl:   Orders Placed This Encounter  Procedures  . Basic metabolic panel  . Magnesium    There are no Patient Instructions on file for this visit.   --Continue cardiac medications as reconciled in final medication list. --Return in about 2 weeks (around 08/04/2020) for heart failure management.. Or sooner if needed. --Continue follow-up with your primary care physician regarding the management of your other chronic comorbid conditions.  Patient's questions and concerns were addressed to his satisfaction. He voices understanding of the instructions provided during this encounter.   This note was created using a voice recognition software as a result there may be grammatical errors inadvertently enclosed that do not reflect the nature of this encounter. Every attempt is made to correct such errors.  Rex Kras, [place], [facility]  Pager: [phone] Office: [phone]

## 2020-07-22 ENCOUNTER — Other Ambulatory Visit: Payer: Self-pay | Admitting: Cardiology

## 2020-08-12 ENCOUNTER — Other Ambulatory Visit: Payer: Self-pay | Admitting: Cardiology

## 2020-08-12 DIAGNOSIS — I5043 Acute on chronic combined systolic (congestive) and diastolic (congestive) heart failure: Secondary | ICD-10-CM

## 2020-09-04 ENCOUNTER — Other Ambulatory Visit: Payer: Self-pay | Admitting: Cardiology

## 2020-09-04 DIAGNOSIS — I5043 Acute on chronic combined systolic (congestive) and diastolic (congestive) heart failure: Secondary | ICD-10-CM

## 2020-09-28 ENCOUNTER — Other Ambulatory Visit: Payer: Self-pay | Admitting: Cardiology

## 2020-09-28 DIAGNOSIS — I5043 Acute on chronic combined systolic (congestive) and diastolic (congestive) heart failure: Secondary | ICD-10-CM

## 2020-10-23 ENCOUNTER — Other Ambulatory Visit: Payer: Self-pay | Admitting: Cardiology

## 2020-10-23 DIAGNOSIS — I5043 Acute on chronic combined systolic (congestive) and diastolic (congestive) heart failure: Secondary | ICD-10-CM

## 2020-11-14 ENCOUNTER — Other Ambulatory Visit: Payer: Self-pay | Admitting: Cardiology

## 2020-11-14 DIAGNOSIS — I5043 Acute on chronic combined systolic (congestive) and diastolic (congestive) heart failure: Secondary | ICD-10-CM

## 2020-12-09 ENCOUNTER — Other Ambulatory Visit: Payer: Self-pay | Admitting: Cardiology

## 2020-12-09 DIAGNOSIS — I5043 Acute on chronic combined systolic (congestive) and diastolic (congestive) heart failure: Secondary | ICD-10-CM

## 2020-12-10 ENCOUNTER — Other Ambulatory Visit: Payer: Self-pay | Admitting: Cardiology

## 2020-12-10 DIAGNOSIS — I5043 Acute on chronic combined systolic (congestive) and diastolic (congestive) heart failure: Secondary | ICD-10-CM

## 2021-01-31 ENCOUNTER — Other Ambulatory Visit: Payer: Self-pay | Admitting: Cardiology

## 2021-01-31 DIAGNOSIS — I5043 Acute on chronic combined systolic (congestive) and diastolic (congestive) heart failure: Secondary | ICD-10-CM

## 2021-02-26 ENCOUNTER — Other Ambulatory Visit: Payer: Self-pay | Admitting: Cardiology

## 2021-02-26 DIAGNOSIS — I5043 Acute on chronic combined systolic (congestive) and diastolic (congestive) heart failure: Secondary | ICD-10-CM

## 2021-03-21 ENCOUNTER — Other Ambulatory Visit: Payer: Self-pay | Admitting: Cardiology

## 2021-03-21 DIAGNOSIS — I5043 Acute on chronic combined systolic (congestive) and diastolic (congestive) heart failure: Secondary | ICD-10-CM

## 2021-04-13 ENCOUNTER — Other Ambulatory Visit: Payer: Self-pay | Admitting: Cardiology

## 2021-04-13 DIAGNOSIS — I5043 Acute on chronic combined systolic (congestive) and diastolic (congestive) heart failure: Secondary | ICD-10-CM

## 2021-05-11 ENCOUNTER — Other Ambulatory Visit: Payer: Self-pay | Admitting: Cardiology

## 2021-05-11 DIAGNOSIS — I5043 Acute on chronic combined systolic (congestive) and diastolic (congestive) heart failure: Secondary | ICD-10-CM

## 2021-06-10 ENCOUNTER — Other Ambulatory Visit: Payer: Self-pay | Admitting: Cardiology

## 2021-06-10 DIAGNOSIS — I5043 Acute on chronic combined systolic (congestive) and diastolic (congestive) heart failure: Secondary | ICD-10-CM

## 2021-06-14 ENCOUNTER — Other Ambulatory Visit: Payer: Self-pay | Admitting: Cardiology

## 2021-06-14 DIAGNOSIS — E1159 Type 2 diabetes mellitus with other circulatory complications: Secondary | ICD-10-CM

## 2021-07-09 ENCOUNTER — Other Ambulatory Visit: Payer: Self-pay | Admitting: Cardiology

## 2021-07-09 DIAGNOSIS — I5043 Acute on chronic combined systolic (congestive) and diastolic (congestive) heart failure: Secondary | ICD-10-CM

## 2021-07-29 ENCOUNTER — Other Ambulatory Visit: Payer: Self-pay | Admitting: Cardiology

## 2021-07-29 DIAGNOSIS — I5043 Acute on chronic combined systolic (congestive) and diastolic (congestive) heart failure: Secondary | ICD-10-CM
